# Patient Record
Sex: Female | Born: 1990 | Race: Black or African American | Hispanic: No | Marital: Single | Smoking: Never smoker
Health system: Southern US, Community
[De-identification: ages and names within clinical notes are randomized; demographics above are authoritative.]

## PROBLEM LIST (undated history)

## (undated) ENCOUNTER — Inpatient Hospital Stay (HOSPITAL_COMMUNITY): Payer: Self-pay

## (undated) DIAGNOSIS — M67432 Ganglion, left wrist: Secondary | ICD-10-CM

## (undated) HISTORY — PX: WISDOM TOOTH EXTRACTION: SHX21

## (undated) HISTORY — PX: GANGLION CYST EXCISION: SHX1691

## (undated) HISTORY — PX: CARPAL TUNNEL RELEASE: SHX101

---
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
## 2008-03-10 ENCOUNTER — Encounter: Admission: RE | Admit: 2008-03-10 | Discharge: 2008-03-10 | Payer: Self-pay | Admitting: Internal Medicine

## 2008-06-09 ENCOUNTER — Emergency Department (HOSPITAL_COMMUNITY): Admission: EM | Admit: 2008-06-09 | Discharge: 2008-06-09 | Payer: Self-pay | Admitting: Emergency Medicine

## 2008-09-24 ENCOUNTER — Ambulatory Visit (HOSPITAL_COMMUNITY): Admission: RE | Admit: 2008-09-24 | Discharge: 2008-09-24 | Payer: Self-pay | Admitting: Obstetrics & Gynecology

## 2008-11-02 ENCOUNTER — Inpatient Hospital Stay (HOSPITAL_COMMUNITY): Admission: AD | Admit: 2008-11-02 | Discharge: 2008-11-02 | Payer: Self-pay | Admitting: Obstetrics

## 2008-11-20 ENCOUNTER — Ambulatory Visit (HOSPITAL_COMMUNITY): Admission: RE | Admit: 2008-11-20 | Discharge: 2008-11-20 | Payer: Self-pay | Admitting: Obstetrics & Gynecology

## 2009-04-10 ENCOUNTER — Inpatient Hospital Stay (HOSPITAL_COMMUNITY): Admission: AD | Admit: 2009-04-10 | Discharge: 2009-04-10 | Payer: Self-pay | Admitting: Obstetrics & Gynecology

## 2009-04-15 ENCOUNTER — Inpatient Hospital Stay (HOSPITAL_COMMUNITY): Admission: AD | Admit: 2009-04-15 | Discharge: 2009-04-18 | Payer: Self-pay | Admitting: Obstetrics

## 2009-08-24 ENCOUNTER — Emergency Department (HOSPITAL_COMMUNITY): Admission: EM | Admit: 2009-08-24 | Discharge: 2009-08-25 | Payer: Self-pay | Admitting: Emergency Medicine

## 2009-09-29 ENCOUNTER — Emergency Department (HOSPITAL_COMMUNITY): Admission: EM | Admit: 2009-09-29 | Discharge: 2009-09-29 | Payer: Self-pay | Admitting: Family Medicine

## 2010-05-04 ENCOUNTER — Emergency Department (HOSPITAL_COMMUNITY)
Admission: EM | Admit: 2010-05-04 | Discharge: 2010-05-04 | Disposition: A | Discharge status: Home / Self Care | Payer: No Typology Code available for payment source | Attending: Emergency Medicine | Admitting: Emergency Medicine

## 2010-05-04 DIAGNOSIS — Z043 Encounter for examination and observation following other accident: Secondary | ICD-10-CM | POA: Insufficient documentation

## 2010-06-13 LAB — URINALYSIS, ROUTINE W REFLEX MICROSCOPIC
Glucose, UA: NEGATIVE mg/dL
Ketones, ur: NEGATIVE mg/dL
Protein, ur: 30 mg/dL — AB
pH: 6.5 (ref 5.0–8.0)

## 2010-06-13 LAB — CBC
HCT: 36 % (ref 36.0–46.0)
Hemoglobin: 10 g/dL — ABNORMAL LOW (ref 12.0–15.0)
Hemoglobin: 11.9 g/dL — ABNORMAL LOW (ref 12.0–15.0)
MCHC: 33.9 g/dL (ref 30.0–36.0)
Platelets: 144 10*3/uL — ABNORMAL LOW (ref 150–400)
RBC: 3.27 MIL/uL — ABNORMAL LOW (ref 3.87–5.11)
RDW: 14.2 % (ref 11.5–15.5)
WBC: 12.7 10*3/uL — ABNORMAL HIGH (ref 4.0–10.5)
WBC: 12.9 10*3/uL — ABNORMAL HIGH (ref 4.0–10.5)

## 2010-06-13 LAB — URINE MICROSCOPIC-ADD ON

## 2010-07-03 LAB — GC/CHLAMYDIA PROBE AMP, GENITAL
Chlamydia, DNA Probe: NEGATIVE
GC Probe Amp, Genital: NEGATIVE

## 2010-07-03 LAB — WET PREP, GENITAL
Clue Cells Wet Prep HPF POC: NONE SEEN
Trich, Wet Prep: NONE SEEN
Yeast Wet Prep HPF POC: NONE SEEN

## 2010-07-08 LAB — URINALYSIS, ROUTINE W REFLEX MICROSCOPIC
Bilirubin Urine: NEGATIVE
Glucose, UA: NEGATIVE mg/dL
Hgb urine dipstick: NEGATIVE
Ketones, ur: 15 mg/dL — AB
Protein, ur: 30 mg/dL — AB
Urobilinogen, UA: 1 mg/dL (ref 0.0–1.0)

## 2010-07-08 LAB — URINE MICROSCOPIC-ADD ON

## 2010-07-18 IMAGING — CR DG CHEST 2V
2 series · 2 of 2 positions shown · non-contrast
Comparison: Chest 06/09/2008.

CLINICAL DATA: Chest pain.

CHEST - 2 VIEW

[w chest pa]
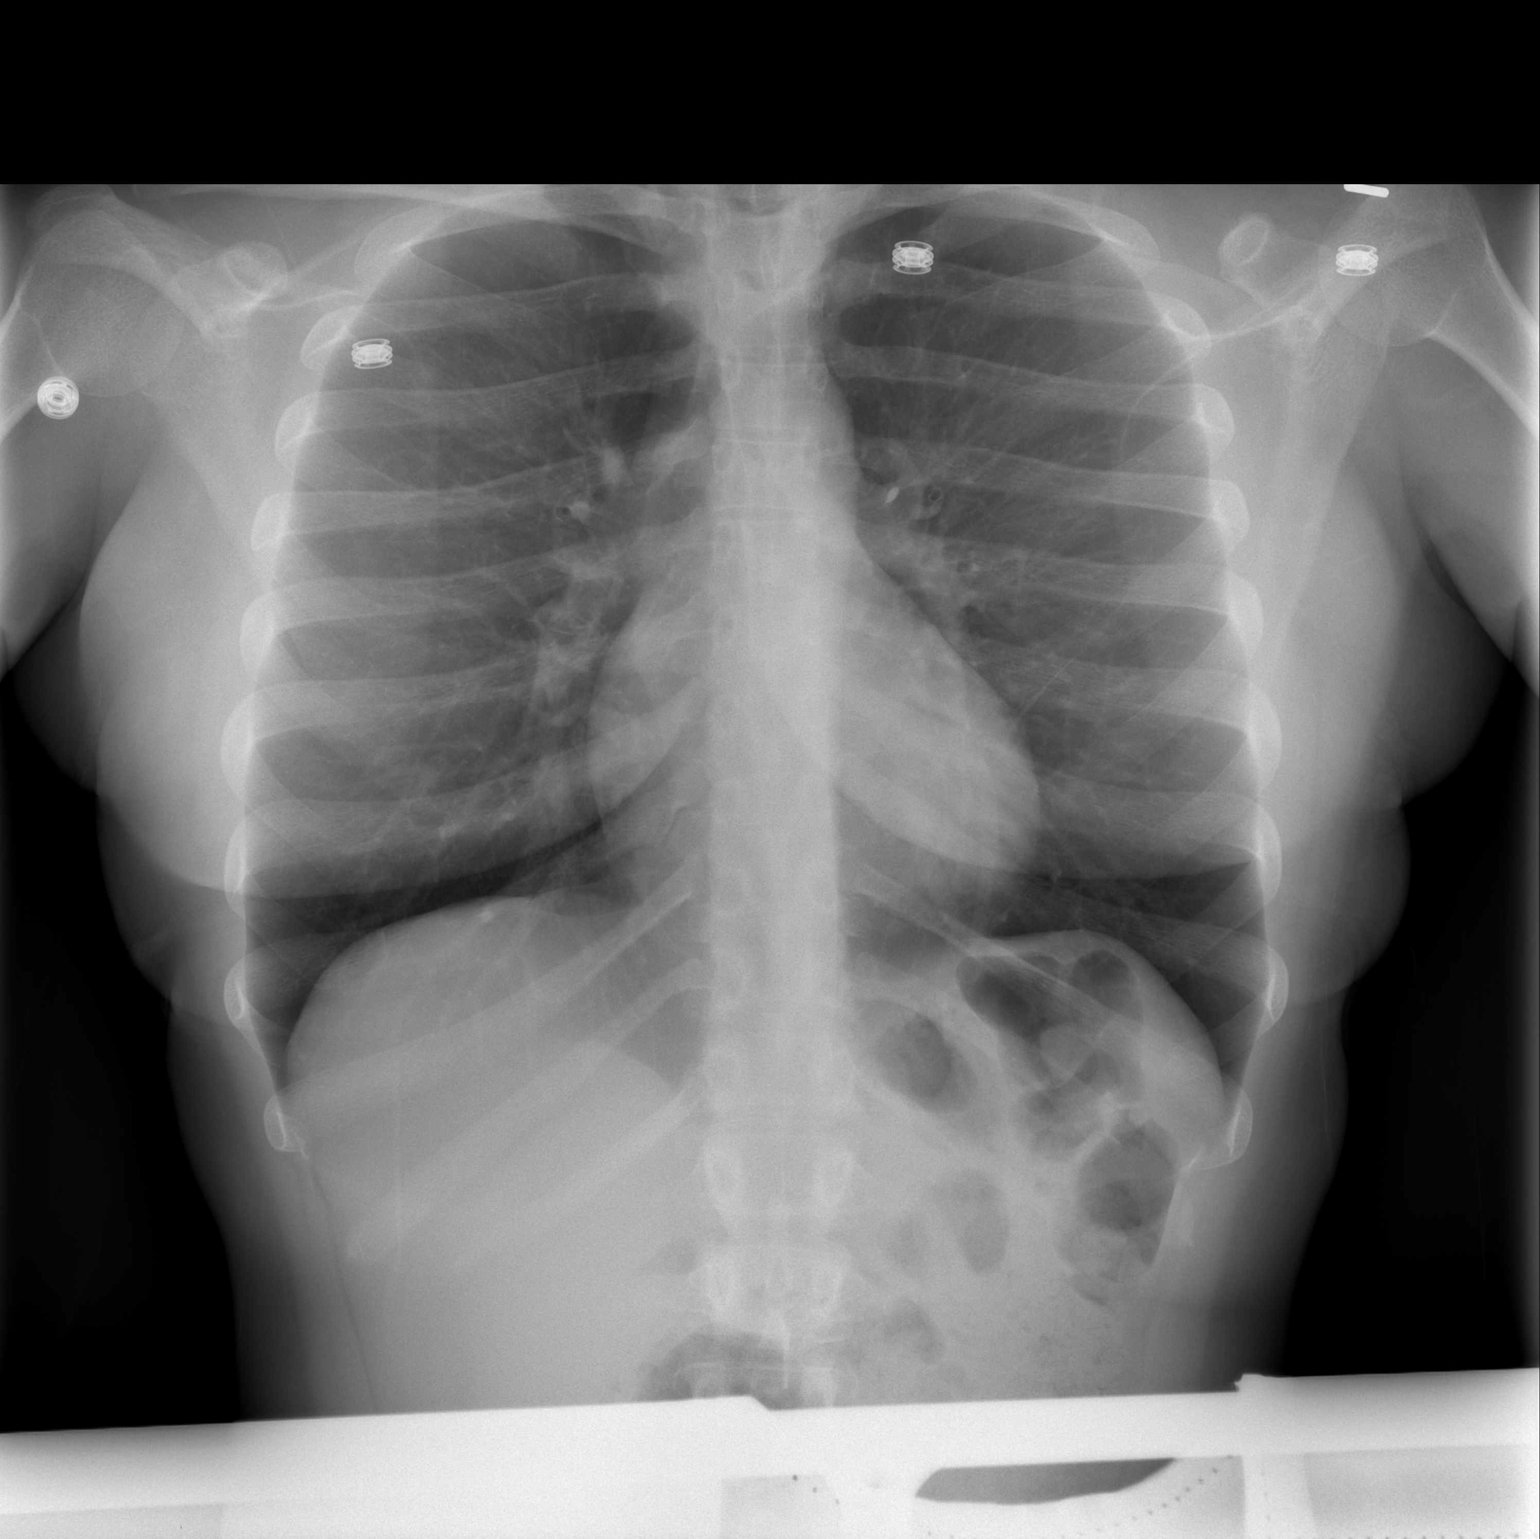

[w chest lat]
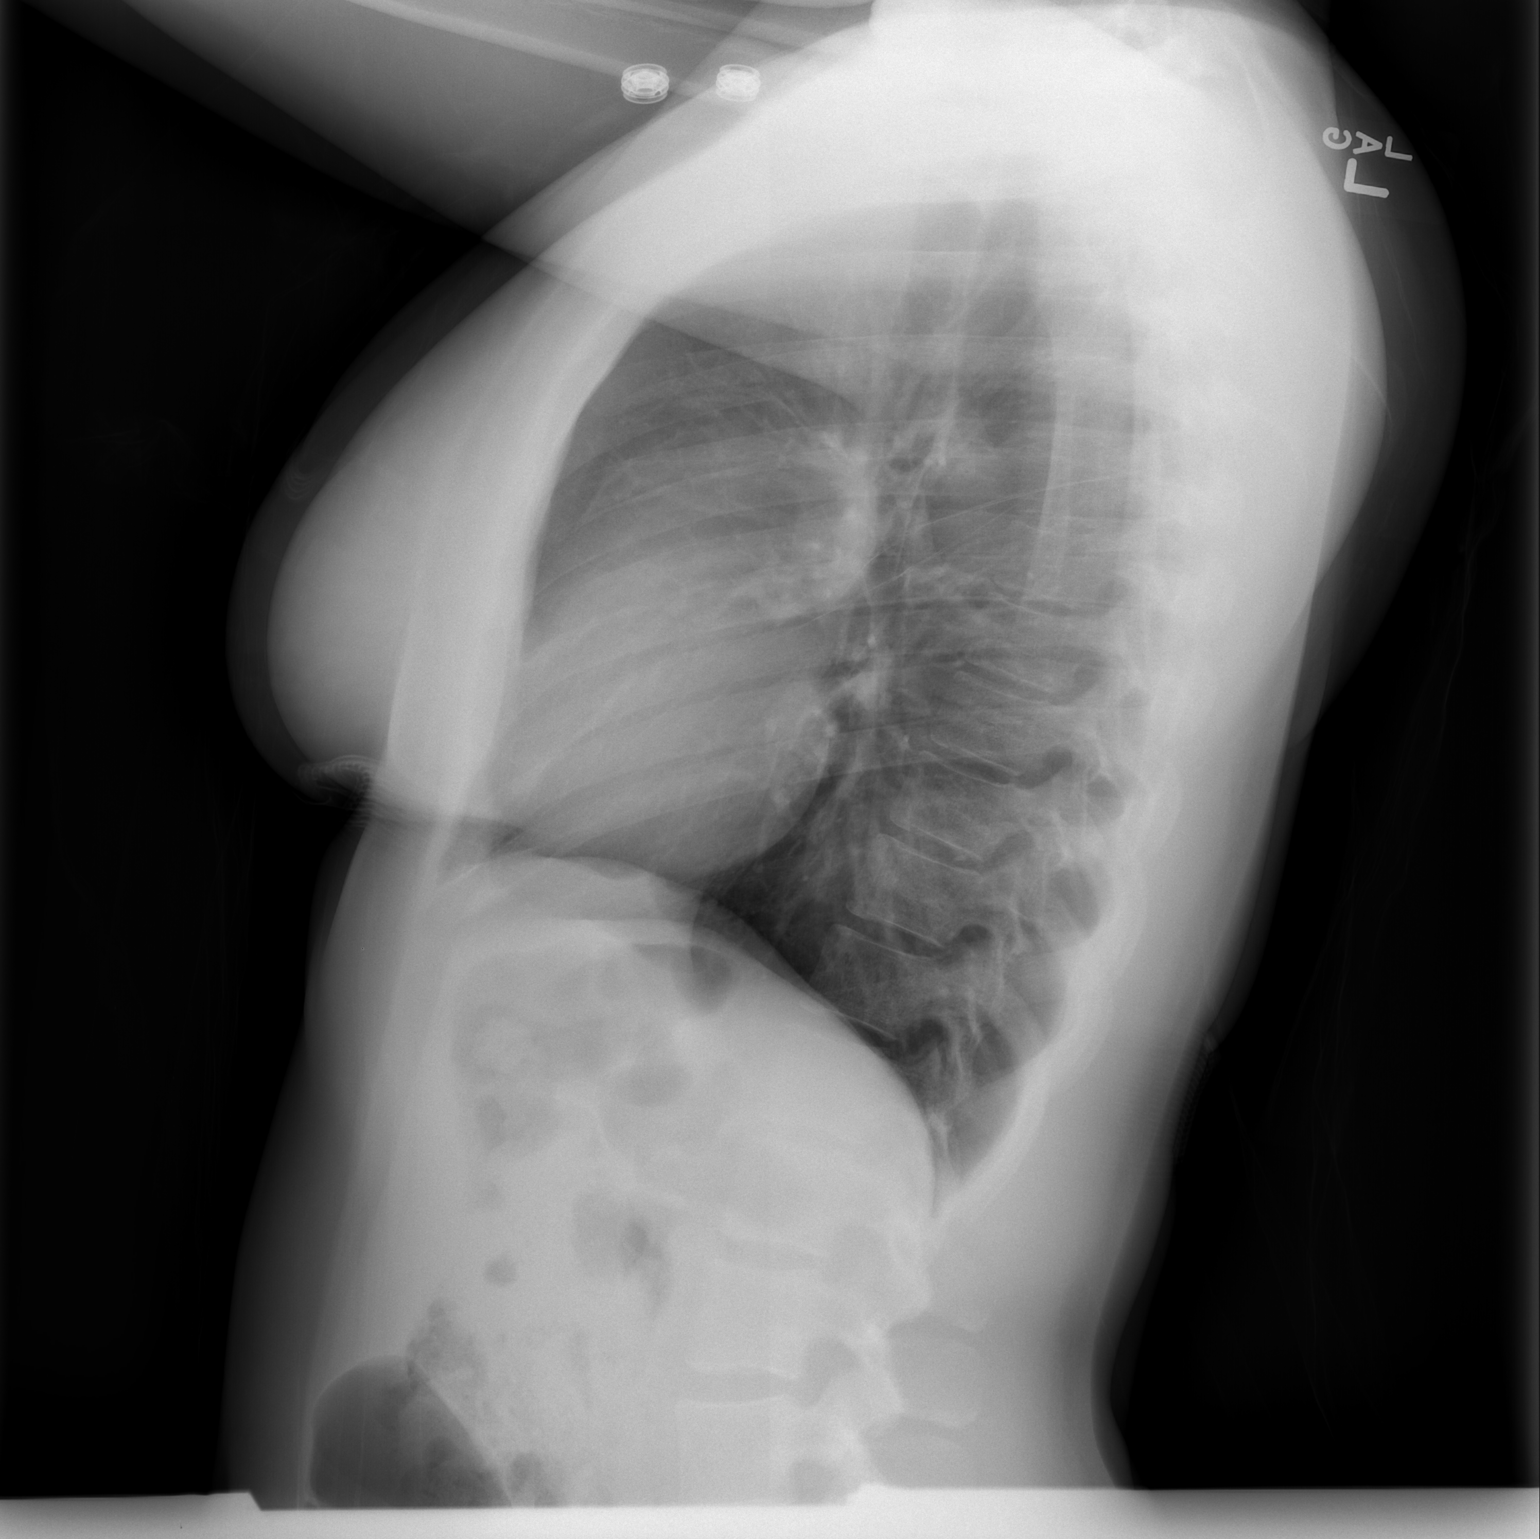

[2 of 2 positions shown; findings below may reference images not displayed]

FINDINGS: Lungs clear.  No pleural effusion or pneumothorax.  Heart
size normal.  No focal bony abnormality.
IMPRESSION: Negative chest.

## 2010-12-20 LAB — POCT URINALYSIS DIP (DEVICE)
Glucose, UA: NEGATIVE
Nitrite: NEGATIVE
Operator id: 235561
Protein, ur: 30 — AB
Specific Gravity, Urine: 1.02
Urobilinogen, UA: 1

## 2010-12-20 LAB — POCT PREGNANCY, URINE
Operator id: 235561
Preg Test, Ur: POSITIVE

## 2011-01-07 LAB — GC/CHLAMYDIA PROBE AMP, GENITAL
Chlamydia, DNA Probe: POSITIVE — AB
GC Probe Amp, Genital: NEGATIVE

## 2011-01-07 LAB — POCT PREGNANCY, URINE: Operator id: 282151

## 2011-01-07 LAB — WET PREP, GENITAL: Clue Cells Wet Prep HPF POC: NONE SEEN

## 2011-07-03 ENCOUNTER — Encounter (HOSPITAL_COMMUNITY): Payer: Self-pay

## 2011-07-03 ENCOUNTER — Emergency Department (HOSPITAL_COMMUNITY)
Admission: EM | Admit: 2011-07-03 | Discharge: 2011-07-03 | Disposition: A | Discharge status: Home / Self Care | Payer: Medicaid Other | Source: Home / Self Care | Attending: Family Medicine | Admitting: Family Medicine

## 2011-07-03 ENCOUNTER — Emergency Department (INDEPENDENT_AMBULATORY_CARE_PROVIDER_SITE_OTHER)
Admission: EM | Admit: 2011-07-03 | Discharge: 2011-07-03 | Disposition: A | Discharge status: Home / Self Care | Payer: Medicaid Other | Source: Home / Self Care | Attending: Emergency Medicine | Admitting: Emergency Medicine

## 2011-07-03 DIAGNOSIS — H00029 Hordeolum internum unspecified eye, unspecified eyelid: Secondary | ICD-10-CM

## 2011-07-03 MED ORDER — TOBRAMYCIN-DEXAMETHASONE 0.3-0.1 % OP SUSP: 1.0000 [drp] | Freq: Four times a day (QID) | OPHTHALMIC | Status: AC

## 2011-07-03 NOTE — Discharge Instructions (Signed)
Sty  A sty (hordeolum) is an infection of a gland in the eyelid located at the base of the eyelash. A sty may develop a white or yellow head of pus. It can be puffy (swollen). Usually, the sty will burst and pus will come out on its own. They do not leave lumps in the eyelid once they drain.  A sty is often confused with another form of cyst of the eyelid called a chalazion. Chalazions occur within the eyelid and not on the edge where the bases of the eyelashes are. They often are red, sore and then form firm lumps in the eyelid.  CAUSES    Germs (bacteria).   Lasting (chronic) eyelid inflammation.  SYMPTOMS    Tenderness, redness and swelling along the edge of the eyelid at the base of the eyelashes.   Sometimes, there is a white or yellow head of pus. It may or may not drain.  DIAGNOSIS   An ophthalmologist will be able to distinguish between a sty and a chalazion and treat the condition appropriately.   TREATMENT    Styes are typically treated with warm packs (compresses) until drainage occurs.   In rare cases, medicines that kill germs (antibiotics) may be prescribed. These antibiotics may be in the form of drops, cream or pills.   If a hard lump has formed, it is generally necessary to do a small incision and remove the hardened contents of the cyst in a minor surgical procedure done in the office.   In suspicious cases, your caregiver may send the contents of the cyst to the lab to be certain that it is not a rare, but dangerous form of cancer of the glands of the eyelid.  HOME CARE INSTRUCTIONS    Wash your hands often and dry them with a clean towel. Avoid touching your eyelid. This may spread the infection to other parts of the eye.   Apply heat to your eyelid for 10 to 20 minutes, several times a day, to ease pain and help to heal it faster.   Do not squeeze the sty. Allow it to drain on its own. Wash your eyelid carefully 3 to 4 times per day to remove any pus.  SEEK IMMEDIATE MEDICAL CARE IF:     Your eye becomes painful or puffy (swollen).   Your vision changes.   Your sty does not drain by itself within 3 days.   Your sty comes back within a short period of time, even with treatment.   You have redness (inflammation) around the eye.   You have a fever.  Document Released: 12/22/2004 Document Revised: 03/03/2011 Document Reviewed: 08/26/2008  [employer] Patient Information 2012 [employer].

## 2011-07-03 NOTE — ED Provider Notes (Addendum)
History     CSN: [id number]  Arrival date & time 07/03/11  8295   First MD Initiated Contact with Patient 07/03/11 1839      Chief Complaint  Patient presents with  . Eye Problem    right lower eyelid swollen    (Consider location/radiation/quality/duration/timing/severity/associated sxs/prior treatment) HPI Comments: Patient presents urgent care today after she has been noticing right lower eyelid to be swollen and mildly tender for about 2 days she describes that during the day the swelling and tenderness is much worse as the day progresses swelling seems to get better. She has been applying warm compresses to it with partial relief.  On Thursday she underwent minimal surgical procedure to insert a subcutaneous contraceptive device. Has been sore and have noticed some bleeding soaking through the posterior sterile-strips was also requesting a form to be checked for signs of infection.  Patient is a 21 y.o. female presenting with eye problem. The history is provided by the patient.  Eye Problem  This is a new problem. The current episode started 2 days ago. The problem occurs constantly. The problem has been gradually worsening. There is pain in the right eye. There was no injury mechanism. Pertinent negatives include no numbness, no blurred vision, no decreased vision, no discharge, no double vision, no foreign body sensation, no photophobia, no eye redness, no nausea, no tingling and no weakness.    History reviewed. No pertinent past medical history.  History reviewed. No pertinent past surgical history.  No family history on file.  History  Substance Use Topics  . Smoking status: Never Smoker   . Smokeless tobacco: Not on file  . Alcohol Use: No    OB History    Grav Para Term Preterm Abortions TAB SAB Ect Mult Living                  Review of Systems  Constitutional: Negative for activity change.  HENT: Negative for neck pain and ear discharge.   Eyes: Positive  for itching. Negative for blurred vision, double vision, photophobia, discharge, redness and visual disturbance.  Gastrointestinal: Negative for nausea.  Neurological: Negative for dizziness, tingling, weakness, numbness and headaches.    Allergies  Review of patient's allergies indicates no known allergies.  Home Medications  No current outpatient prescriptions on file.  There were no vitals taken for this visit.  Physical Exam  Nursing note and vitals reviewed. Constitutional: She appears well-developed and well-nourished.  HENT:  Head: Normocephalic.  Eyes: Conjunctivae and EOM are normal. Right eye exhibits no discharge and no exudate. No foreign body present in the right eye. Left eye exhibits hordeolum. Left eye exhibits no discharge and no exudate. No foreign body present in the left eye. No scleral icterus.  Skin: Skin is warm. Lesion noted. No abrasion noted.       ED Course  Procedures (including critical care time)  Labs Reviewed - No data to display No results found.   No diagnosis found.    MDM  Patient presents with 2 different complaints early internal horduolum and concern about some soreness minimal bleeding of a recently inserted contraceptive left upper inner aspect of her arm        Jimmie Molly, MD 07/03/11 2014  Jimmie Molly, MD 07/03/11 2014

## 2011-07-03 NOTE — ED Notes (Signed)
Right lower eyelid swollen, has used warm compresses without relief for 2 days

## 2012-03-31 ENCOUNTER — Emergency Department (INDEPENDENT_AMBULATORY_CARE_PROVIDER_SITE_OTHER)
Admission: EM | Admit: 2012-03-31 | Discharge: 2012-03-31 | Disposition: A | Discharge status: Home / Self Care | Payer: Self-pay | Source: Home / Self Care

## 2012-03-31 ENCOUNTER — Encounter (HOSPITAL_COMMUNITY): Payer: Self-pay | Admitting: Emergency Medicine

## 2012-03-31 DIAGNOSIS — H6590 Unspecified nonsuppurative otitis media, unspecified ear: Secondary | ICD-10-CM

## 2012-03-31 LAB — POCT URINALYSIS DIP (DEVICE)
Bilirubin Urine: NEGATIVE
Glucose, UA: NEGATIVE mg/dL
Specific Gravity, Urine: 1.015 (ref 1.005–1.030)
Urobilinogen, UA: 1 mg/dL (ref 0.0–1.0)

## 2012-03-31 LAB — POCT PREGNANCY, URINE: Preg Test, Ur: NEGATIVE

## 2012-03-31 MED ORDER — DIPHENHYDRAMINE HCL 50 MG/ML IJ SOLN: 25.0000 mg | Freq: Once | INTRAMUSCULAR | Status: DC

## 2012-03-31 MED ORDER — DIPHENHYDRAMINE HCL 50 MG/ML IJ SOLN
25.0000 mg | Freq: Once | INTRAMUSCULAR | Status: AC
  Administered 2012-03-31: 25 mg via INTRAMUSCULAR

## 2012-03-31 MED ORDER — DIPHENHYDRAMINE HCL 50 MG/ML IJ SOLN
INTRAMUSCULAR | Status: AC
  Filled 2012-03-31: qty 1

## 2012-03-31 NOTE — ED Provider Notes (Signed)
History     CSN: [id number]  Arrival date & time 03/31/12  1421   None     Chief Complaint  Patient presents with  . Dizziness    HPI: The history is provided by the patient. No language interpreter was used.  Pt reports that she had cold symptoms approx 2-3 weeks ago that resolved. Since that time she has had persistent intermittent dizziness and occasional mild frontal area h/a. The dizziness occurs with sudden position change and bending over. At times she feels dizzy when she is sitting. States it feels like she is moving but she is not. States the dizziness feels more like her equilibrium is off as if she were drunk bu is not. She denies ear pain or drainage, fever, sorethroat or other associated sx's.  History reviewed. No pertinent past medical history.  Past Surgical History  Procedure Date  . Mouth surgery     No family history on file.  History  Substance Use Topics  . Smoking status: Never Smoker   . Smokeless tobacco: Not on file  . Alcohol Use: No    OB History    Grav Para Term Preterm Abortions TAB SAB Ect Mult Living                  Review of Systems  Constitutional: Negative for fever and activity change.  HENT: Negative for ear pain, congestion, facial swelling, rhinorrhea, sneezing, tinnitus and ear discharge.   Eyes: Negative for discharge.  Respiratory: Negative for cough and shortness of breath.   Neurological: Positive for headaches.    Allergies  Review of patient's allergies indicates no known allergies.  Home Medications   Current Outpatient Rx  Name  Route  Sig  Dispense  Refill  . IMPLANON [place]   Subcutaneous   Inject into the skin.           BP 138/81  Pulse 71  Temp 98.2 F (36.8 C) (Oral)  Resp 18  SpO2 99%  Physical Exam  Constitutional: She is oriented to person, place, and time. She appears well-developed and well-nourished.  HENT:  Head: Normocephalic and atraumatic.  Right Ear: No drainage or tenderness.  Tympanic membrane is not erythematous. A middle ear effusion is present.  Left Ear: No drainage or tenderness. Tympanic membrane is not erythematous. A middle ear effusion is present.  Nose: Nose normal.  Mouth/Throat: Uvula is midline, oropharynx is clear and moist and mucous membranes are normal.       Bilateral serous otitis-Fluid clear  Eyes: Conjunctivae normal and EOM are normal. Pupils are equal, round, and reactive to light. Right eye exhibits no discharge. Left eye exhibits no discharge.  Neck: Normal range of motion. Neck supple.  Cardiovascular: Normal rate.   Pulmonary/Chest: Effort normal.  Neurological: She is alert and oriented to person, place, and time. She has normal strength and normal reflexes. No cranial nerve deficit. She displays a negative Romberg sign. Coordination and gait normal.    ED Course  Procedures   Labs Reviewed  POCT URINALYSIS DIP (DEVICE) - Abnormal; Notable for the following:    Leukocytes, UA TRACE (*)  Biochemical Testing Only. Please order routine urinalysis from main lab if confirmatory testing is needed.   All other components within normal limits  POCT PREGNANCY, URINE   No results found.   No diagnosis found.    MDM  Otherwise healthy 22 y/o w/ c/o dizziness and mild intermittent h/a since a cold 2 to 3 weeks  ago. Neurological exam w/o focal deficits. PE findings c/w serous otitis. Put on antihistamines x 5-7 days w/ ENT referral if no improvement.        Leanne Chang, NP 03/31/12 1842

## 2012-03-31 NOTE — ED Notes (Signed)
Pt c/o feeling dizzy and having headaches x3 days.  Sx include: nauseas  [facility] but [facility] couldn't help her Denies: fevers, vomiting, diarrhea, ear problems  Unsure of LMP.Alison Morton it was 2-3 months ago.   She is alert w/no signs of acute distress.

## 2012-04-03 NOTE — ED Provider Notes (Signed)
Medical screening examination/treatment/procedure(s) were performed by resident physician or non-physician practitioner and as supervising physician I was immediately available for consultation/collaboration.   Joliene Salvador DOUGLAS MD.    Jaydee Conran D Ercole Georg, MD 04/03/12 1804 

## 2012-05-11 ENCOUNTER — Ambulatory Visit: Payer: Self-pay | Admitting: Internal Medicine

## 2012-06-14 ENCOUNTER — Telehealth: Payer: Self-pay | Admitting: *Deleted

## 2012-06-14 NOTE — Telephone Encounter (Signed)
Please call regarding nexplanon.  Pt wants to give herself a break from birth control- cycle is "out of wack' and weight gain. Pt is having prolonged bleeding x3 weeks. Pt got nexplanon 06/2011. Pt given appointment for removal.

## 2012-07-25 ENCOUNTER — Ambulatory Visit: Payer: Self-pay | Admitting: Obstetrics & Gynecology

## 2012-07-26 ENCOUNTER — Ambulatory Visit: Payer: Self-pay | Admitting: Obstetrics & Gynecology

## 2012-08-15 ENCOUNTER — Ambulatory Visit (INDEPENDENT_AMBULATORY_CARE_PROVIDER_SITE_OTHER): Payer: BC Managed Care – PPO | Admitting: Obstetrics & Gynecology

## 2012-08-15 ENCOUNTER — Encounter: Payer: Self-pay | Admitting: Obstetrics & Gynecology

## 2012-08-15 VITALS — BP 118/78 | HR 91 | Temp 98.3°F | Ht 62.0 in | Wt 160.0 lb

## 2012-08-15 DIAGNOSIS — Z3046 Encounter for surveillance of implantable subdermal contraceptive: Secondary | ICD-10-CM

## 2012-08-15 NOTE — Progress Notes (Signed)
Reasons for removal:  Pt choice   A timeout was performed confirming the patient, the procedure and allergy status. The patient's left  arm was palpated and the implant device located. The area was prepped with Betadinex3. The distal end of the device was palpated and 3 cc of 1% lidocaine was injected. A 5 mm incision was made. Any fibrotic tissue was carefully dissected away using blunt and/or sharp dissection. The device was removed in an intact manner. Steri-strips and a sterile dressing were applied to the incision. The patient tolerated the procedure well.  New contraceptive method: no method

## 2012-08-15 NOTE — Patient Instructions (Signed)

## 2012-08-15 NOTE — Progress Notes (Deleted)
.   Subjective:     Alison Morton is a 22 y.o. female here for her Nexplanon removal, she had it inserted 06/30/11.  Current complaints: irregular cycles.  Patient states that she wants a break from birth control.   Personal health questionnaire reviewed: {yes/no:9010}.   Gynecologic History Patient's last menstrual period was 07/26/2012. Contraception: Nexplanon Last Pap: 02/2010. Results were: normal Last mammogram: N/A  Obstetric History OB History   Grav Para Term Preterm Abortions TAB SAB Ect Mult Living   1 1 1       1      # Outc Date GA Lbr Len/2nd Wgt Sex Del Anes PTL Lv   1 TRM 1/11 [redacted]w[redacted]d  7lb15oz(3.6kg) M SVD EPI  Yes   Comments: No complications       {Common ambulatory SmartLinks:19316}  Review of Systems {ros; complete:30496}    Objective:    {exam; complete:18323}    Assessment:    Healthy female exam.    Plan:    {plan:19193}

## 2012-08-16 ENCOUNTER — Encounter: Payer: Self-pay | Admitting: Obstetrics & Gynecology

## 2013-04-23 ENCOUNTER — Emergency Department (INDEPENDENT_AMBULATORY_CARE_PROVIDER_SITE_OTHER)
Admission: EM | Admit: 2013-04-23 | Discharge: 2013-04-23 | Disposition: A | Discharge status: Home / Self Care | Payer: Self-pay | Source: Home / Self Care | Attending: Family Medicine | Admitting: Family Medicine

## 2013-04-23 ENCOUNTER — Encounter (HOSPITAL_COMMUNITY): Payer: Self-pay | Admitting: Emergency Medicine

## 2013-04-23 ENCOUNTER — Other Ambulatory Visit (HOSPITAL_COMMUNITY)
Admission: RE | Admit: 2013-04-23 | Discharge: 2013-04-23 | Disposition: A | Discharge status: Home / Self Care | Payer: Self-pay | Source: Ambulatory Visit | Attending: Family Medicine | Admitting: Family Medicine

## 2013-04-23 DIAGNOSIS — Z113 Encounter for screening for infections with a predominantly sexual mode of transmission: Secondary | ICD-10-CM | POA: Insufficient documentation

## 2013-04-23 DIAGNOSIS — N760 Acute vaginitis: Secondary | ICD-10-CM

## 2013-04-23 LAB — POCT URINALYSIS DIP (DEVICE)
Bilirubin Urine: NEGATIVE
GLUCOSE, UA: NEGATIVE mg/dL
Hgb urine dipstick: NEGATIVE
Ketones, ur: NEGATIVE mg/dL
Leukocytes, UA: NEGATIVE
Nitrite: NEGATIVE
Protein, ur: NEGATIVE mg/dL
Urobilinogen, UA: 0.2 mg/dL (ref 0.0–1.0)
pH: 6.5 (ref 5.0–8.0)

## 2013-04-23 LAB — POCT PREGNANCY, URINE: Preg Test, Ur: NEGATIVE

## 2013-04-23 NOTE — ED Provider Notes (Signed)
CSN: [id number]     Arrival date & time 04/23/13  [id number] History   First MD Initiated Contact with Patient 04/23/13 [phone]     Chief Complaint  Patient presents with  . Vaginal Discharge   (Consider location/radiation/quality/duration/timing/severity/associated sxs/prior Treatment) Patient is a 23 y.o. female presenting with vaginal discharge. The history is provided by the patient.  Vaginal Discharge Quality:  White Severity:  Mild Onset quality:  Gradual Duration:  3 days Timing:  Intermittent Chronicity:  New Associated symptoms: genital lesions, nausea, vaginal itching and vomiting   Associated symptoms: no abdominal pain and no dysuria     History reviewed. No pertinent past medical history. Past Surgical History  Procedure Laterality Date  . Mouth surgery     Family History  Problem Relation Age of Onset  . Cancer Maternal Grandmother     bone  . Stroke Maternal Grandfather   . Cancer Maternal Grandfather     breast  . Hypertension Father    History  Substance Use Topics  . Smoking status: Never Smoker   . Smokeless tobacco: Not on file  . Alcohol Use: No   OB History   Grav Para Term Preterm Abortions TAB SAB Ect Mult Living   1 1 1       1      Review of Systems  Constitutional: Negative.   Gastrointestinal: Positive for nausea and vomiting. Negative for abdominal pain.  Genitourinary: Positive for vaginal discharge. Negative for dysuria.    Allergies  Flonase  Home Medications   Current Outpatient Rx  Name  Route  Sig  Dispense  Refill  . Etonogestrel (IMPLANON [place])   Subcutaneous   Inject into the skin.          BP 134/87  Pulse 90  Temp(Src) 99.1 F (37.3 C) (Oral)  Resp 14  SpO2 99% Physical Exam  Nursing note and vitals reviewed. Constitutional: She is oriented to person, place, and time. She appears well-developed and well-nourished.  Abdominal: Soft. Bowel sounds are normal.  Genitourinary: Vagina normal and uterus normal.     There is lesion on the left labia. No tenderness around the vagina. No vaginal discharge found.  Neurological: She is alert and oriented to person, place, and time.  Skin: Skin is warm and dry.    ED Course  Procedures (including critical care time) Labs Review Labs Reviewed  POCT URINALYSIS DIP (DEVICE)  POCT PREGNANCY, URINE   Imaging Review No results found.  EKG Interpretation    Date/Time:    Ventricular Rate:    PR Interval:    QRS Duration:   QT Interval:    QTC Calculation:   R Axis:     Text Interpretation:              MDM      Linna HoffJames D Dalphine Cowie, MD 04/23/13 [phone]

## 2013-04-23 NOTE — ED Notes (Signed)
Call back number verified.  

## 2013-04-23 NOTE — ED Notes (Signed)
Pt c/o white/creamy vag d/c onset 3 days w/sxs that include irritation Denies: f/v/n/d, abd/back pain, urinary sxs She is alert w/no signs of acute distress.

## 2013-04-23 NOTE — Discharge Instructions (Signed)
We will call with test results and treat as indicated °

## 2013-04-25 ENCOUNTER — Telehealth (HOSPITAL_COMMUNITY): Payer: Self-pay | Admitting: *Deleted

## 2013-04-25 NOTE — ED Notes (Addendum)
GC/Chlamydia neg.,  Affirm: Candida pos., Gardnerella and Trich neg.  Discussed with Dr. Artis FlockKindl and he gave a VO for Diflucan 150 mg. 1 po now and 1 refill to take in 1 week if needed.  I called pt. and left a message to call.  Call 1. Alison Morton, Alison Morton M 04/25/2013 Pt. called back.  Pt. verified x 2 and given results.  Pt. told she needs Diflucan for a yeast infection and has 1 refill to take in 1 week if needed.  Pt. wants Rx. called to the [facility] on [address].  Pt.'s questions answered.  Pt. asked what she can do to prevent frequent yeast infections.  I told her to discuss with her OB-GYN.  Rx. called to pharmacist.

## 2014-01-27 ENCOUNTER — Encounter (HOSPITAL_COMMUNITY): Payer: Self-pay | Admitting: Emergency Medicine

## 2015-03-24 ENCOUNTER — Encounter (HOSPITAL_COMMUNITY): Payer: Self-pay | Admitting: *Deleted

## 2015-03-24 ENCOUNTER — Inpatient Hospital Stay (HOSPITAL_COMMUNITY)
Admission: AD | Admit: 2015-03-24 | Discharge: 2015-03-25 | Disposition: A | Discharge status: Home / Self Care | Payer: BLUE CROSS/BLUE SHIELD | Source: Ambulatory Visit | Attending: Family Medicine | Admitting: Family Medicine

## 2015-03-24 DIAGNOSIS — O418X10 Other specified disorders of amniotic fluid and membranes, first trimester, not applicable or unspecified: Secondary | ICD-10-CM

## 2015-03-24 DIAGNOSIS — O209 Hemorrhage in early pregnancy, unspecified: Secondary | ICD-10-CM

## 2015-03-24 DIAGNOSIS — R3915 Urgency of urination: Secondary | ICD-10-CM | POA: Diagnosis present

## 2015-03-24 DIAGNOSIS — N3000 Acute cystitis without hematuria: Secondary | ICD-10-CM | POA: Diagnosis not present

## 2015-03-24 DIAGNOSIS — O26891 Other specified pregnancy related conditions, first trimester: Secondary | ICD-10-CM | POA: Insufficient documentation

## 2015-03-24 DIAGNOSIS — Z3A01 Less than 8 weeks gestation of pregnancy: Secondary | ICD-10-CM | POA: Insufficient documentation

## 2015-03-24 DIAGNOSIS — N760 Acute vaginitis: Secondary | ICD-10-CM | POA: Insufficient documentation

## 2015-03-24 DIAGNOSIS — R1030 Lower abdominal pain, unspecified: Secondary | ICD-10-CM | POA: Diagnosis not present

## 2015-03-24 DIAGNOSIS — A499 Bacterial infection, unspecified: Secondary | ICD-10-CM | POA: Diagnosis not present

## 2015-03-24 DIAGNOSIS — O23591 Infection of other part of genital tract in pregnancy, first trimester: Secondary | ICD-10-CM | POA: Diagnosis not present

## 2015-03-24 DIAGNOSIS — O468X1 Other antepartum hemorrhage, first trimester: Secondary | ICD-10-CM

## 2015-03-24 DIAGNOSIS — B9689 Other specified bacterial agents as the cause of diseases classified elsewhere: Secondary | ICD-10-CM

## 2015-03-24 DIAGNOSIS — Z3491 Encounter for supervision of normal pregnancy, unspecified, first trimester: Secondary | ICD-10-CM

## 2015-03-24 DIAGNOSIS — O4691 Antepartum hemorrhage, unspecified, first trimester: Secondary | ICD-10-CM | POA: Diagnosis not present

## 2015-03-24 LAB — URINE MICROSCOPIC-ADD ON

## 2015-03-24 LAB — URINALYSIS, ROUTINE W REFLEX MICROSCOPIC
BILIRUBIN URINE: NEGATIVE
Glucose, UA: NEGATIVE mg/dL
Ketones, ur: NEGATIVE mg/dL
Nitrite: POSITIVE — AB
Protein, ur: 30 mg/dL — AB
Specific Gravity, Urine: 1.015 (ref 1.005–1.030)
pH: 8.5 — ABNORMAL HIGH (ref 5.0–8.0)

## 2015-03-24 LAB — CBC
HEMATOCRIT: 36.4 % (ref 36.0–46.0)
HEMOGLOBIN: 12.1 g/dL (ref 12.0–15.0)
MCH: 29.6 pg (ref 26.0–34.0)
MCHC: 33.2 g/dL (ref 30.0–36.0)
MCV: 89 fL (ref 78.0–100.0)
Platelets: 239 10*3/uL (ref 150–400)
RBC: 4.09 MIL/uL (ref 3.87–5.11)
RDW: 13.8 % (ref 11.5–15.5)
WBC: 11.7 10*3/uL — ABNORMAL HIGH (ref 4.0–10.5)

## 2015-03-24 LAB — ABO/RH: ABO/RH(D): O POS

## 2015-03-24 LAB — POCT PREGNANCY, URINE: PREG TEST UR: POSITIVE — AB

## 2015-03-24 NOTE — MAU Provider Note (Signed)
History     CSN: [id number]  Arrival date and time: 03/24/15 2141   First Provider Initiated Contact with Patient 03/24/15 2316        Chief Complaint  Patient presents with  . Abdominal Pain  . Urinary Urgency   HPI  Alison Morton is a 24 y.o. G2P1001 at [redacted]w[redacted]d who presents with urinary urgency, abdominal pain, & pink discharge.  Intermittent sharp lower abdominal pain x 2 weeks. Rates as 3/10 & describes as dull/sharp pain. No treatment. Currently not hurting.  Dysuria & urgency this week. Denies hematuria, flank pain, or fever.  Pink discharge on toilet paper today. Denies recent intercourse. Denies any other discharge.  Denies n/v/d. Some constipation. Last BM was Saturday.  Scheduled first prenatal visit with [facility] ob/gyn this Friday. Has never been seen there before.    OB History    Gravida Para Term Preterm AB TAB SAB Ectopic Multiple Living   No past medical history on file.  Past Surgical History  Procedure Laterality Date  . Mouth surgery      Family History  Problem Relation Age of Onset  . Cancer Maternal Grandmother     bone  . Stroke Maternal Grandfather   . Cancer Maternal Grandfather     breast  . Hypertension Father     Social History  Substance Use Topics  . Smoking status: Never Smoker   . Smokeless tobacco: Not on file  . Alcohol Use: No    Allergies:  Allergies  Allergen Reactions  . Flonase [Fluticasone Propionate]     Prescriptions prior to admission  Medication Sig Dispense Refill Last Dose  . Etonogestrel (IMPLANON [place]) Inject into the skin.   Unknown at Unknown time    Review of Systems  Constitutional: Negative for fever and chills.  Gastrointestinal: Positive for abdominal pain and constipation. Negative for nausea, vomiting and diarrhea.  Genitourinary: Positive for dysuria and urgency. Negative for frequency, hematuria and flank pain.   Physical Exam   Blood pressure 138/81, pulse 88,  temperature 98.1 F (36.7 C), temperature source Oral, resp. rate 18, height  (1.6 m), weight 189 lb (85.73 kg), last menstrual period 02/04/2015.  Physical Exam  Nursing note and vitals reviewed. Constitutional: She is oriented to person, place, and time. She appears well-developed and well-nourished. No distress.  HENT:  Head: Normocephalic and atraumatic.  Eyes: Conjunctivae are normal. Right eye exhibits no discharge. Left eye exhibits no discharge. No scleral icterus.  Neck: Normal range of motion.  Cardiovascular: Normal rate, regular rhythm and normal heart sounds.   No murmur heard. Respiratory: Effort normal and breath sounds normal. No respiratory distress. She has no wheezes.  GI: Soft. Bowel sounds are normal. She exhibits no distension. There is no tenderness. There is no rebound and no CVA tenderness.  Genitourinary: Uterus normal. Cervix exhibits discharge (minimal amount of thin gray discharge. no blood. ). Cervix exhibits no motion tenderness and no friability. Right adnexum displays no mass, no tenderness and no fullness. Left adnexum displays no mass, no tenderness and no fullness.  Cervix closed  Neurological: She is alert and oriented to person, place, and time.  Skin: Skin is warm and dry. She is not diaphoretic.  Psychiatric: She has a normal mood and affect. Her behavior is normal. Judgment and thought content normal.    MAU Course  Procedures Results for orders placed or performed during the  hospital encounter of 03/24/15 (from the past 24 hour(s))  Urinalysis, Routine w reflex microscopic (not at [facility])     Status: Abnormal   Collection Time: 03/24/15 10:04 PM  Result Value Ref Range   Color, Urine YELLOW YELLOW   APPearance CLEAR CLEAR   Specific Gravity, Urine 1.015 1.005 - 1.030   pH 8.5 (H) 5.0 - 8.0   Glucose, UA NEGATIVE NEGATIVE mg/dL   Hgb urine dipstick LARGE (A) NEGATIVE   Bilirubin Urine NEGATIVE NEGATIVE   Ketones, ur NEGATIVE NEGATIVE mg/dL    Protein, ur 30 (A) NEGATIVE mg/dL   Nitrite POSITIVE (A) NEGATIVE   Leukocytes, UA MODERATE (A) NEGATIVE  Urine microscopic-add on     Status: Abnormal   Collection Time: 03/24/15 10:04 PM  Result Value Ref Range   Squamous Epithelial / LPF 0-5 (A) NONE SEEN   WBC, UA 6-30 0 - 5 WBC/hpf   RBC / HPF 6-30 0 - 5 RBC/hpf   Bacteria, UA MANY (A) NONE SEEN  Pregnancy, urine POC     Status: Abnormal   Collection Time: 03/24/15 10:25 PM  Result Value Ref Range   Preg Test, Ur POSITIVE (A) NEGATIVE  CBC     Status: Abnormal   Collection Time: 03/24/15 11:30 PM  Result Value Ref Range   WBC 11.7 (H) 4.0 - 10.5 K/uL   RBC 4.09 3.87 - 5.11 MIL/uL   Hemoglobin 12.1 12.0 - 15.0 g/dL   HCT 16.1 09.6 - 04.5 %   MCV 89.0 78.0 - 100.0 fL   MCH 29.6 26.0 - 34.0 pg   MCHC 33.2 30.0 - 36.0 g/dL   RDW 40.9 81.1 - 91.4 %   Platelets 239 150 - 400 K/uL  ABO/Rh     Status: None   Collection Time: 03/24/15 11:30 PM  Result Value Ref Range   ABO/RH(D) O POS   hCG, quantitative, pregnancy     Status: Abnormal   Collection Time: 03/24/15 11:30 PM  Result Value Ref Range   hCG, Beta Chain, Sharene Butters, S [id number] (H) <5 mIU/mL  Wet prep, genital     Status: Abnormal   Collection Time: 03/24/15 11:55 PM  Result Value Ref Range   Yeast Wet Prep HPF POC NONE SEEN NONE SEEN   Trich, Wet Prep NONE SEEN NONE SEEN   Clue Cells Wet Prep HPF POC PRESENT (A) NONE SEEN   WBC, Wet Prep HPF POC MANY (A) NONE SEEN   Sperm NONE SEEN    US Ob Comp Less 14 Wks  03/25/2015  CLINICAL DATA:  24 year old pregnant female with vaginal bleeding EXAM: OBSTETRIC <14 WK [place] AND TRANSVAGINAL OB US TECHNIQUE: Both transabdominal and transvaginal ultrasound examinations were performed for complete evaluation of the gestation as well as the maternal uterus, adnexal regions, and pelvic cul-de-sac. Transvaginal technique was performed to assess early pregnancy. COMPARISON:  None. FINDINGS: Intrauterine gestational sac: Single intrauterine  gestational sac. Yolk sac:  Seen Embryo:  Present Cardiac Activity: Detected Heart Rate: 153  bpm CRL:  13  mm   7 w   4 d                  [place] EDC: 11/07/2015 Subchorionic hemorrhage:  5.1 x 1.5 x 3.1 cm subchronic hemorrhage. Maternal uterus/adnexae: Unremarkable. Trace free fluid within the pelvis. IMPRESSION: Single live intrauterine pregnancy with an estimated gestational age of [redacted] weeks, 4 days. Small subchorionic hemorrhage. Follow-up recommended. Electronically Signed   By: Ceasar Mons.D.  On: 03/25/2015 00:43   [place] Ob Transvaginal  03/25/2015  CLINICAL DATA:  24 year old pregnant female with vaginal bleeding EXAM: OBSTETRIC <14 WK US AND TRANSVAGINAL OB US TECHNIQUE: Both transabdominal and transvaginal ultrasound examinations were performed for complete evaluation of the gestation as well as the maternal uterus, adnexal regions, and pelvic cul-de-sac. Transvaginal technique was performed to assess early pregnancy. COMPARISON:  None. FINDINGS: Intrauterine gestational sac: Single intrauterine gestational sac. Yolk sac:  Seen Embryo:  Present Cardiac Activity: Detected Heart Rate: 153  bpm CRL:  13  mm   7 w   4 d                  US EDC: 11/07/2015 Subchorionic hemorrhage:  5.1 x 1.5 x 3.1 cm subchronic hemorrhage. Maternal uterus/adnexae: Unremarkable. Trace free fluid within the pelvis. IMPRESSION: Single live intrauterine pregnancy with an estimated gestational age of [redacted] weeks, 4 days. Small subchorionic hemorrhage. Follow-up recommended. Electronically Signed   By: Elgie CollardArash  Radparvar M.D.   On: 03/25/2015 00:43    MDM O positive Ultrasound shows SIUP @ 2232w4d with small SCH UTI per u/a   Assessment and Plan  A: 1. Normal IUP (intrauterine pregnancy) on prenatal ultrasound, first trimester   2. Vaginal bleeding in pregnancy, first trimester   3. Subchorionic hematoma in first trimester   4. BV (bacterial vaginosis)   5. Acute cystitis without hematuria    P: Discharge home Rx keflex  & flagyl Discussed reasons to return to MAU Urine culture pending Keep scheduled prenatal appt  Judeth HornErin Kaitlan Bin, NP  03/24/2015, 10:15 PM

## 2015-03-24 NOTE — MAU Note (Signed)
Patient having urgency to urinate but no burning. Also having dull pains in lower abdomen. No leaking or bleeding but some pink discharge

## 2015-03-25 ENCOUNTER — Inpatient Hospital Stay (HOSPITAL_COMMUNITY): Payer: BLUE CROSS/BLUE SHIELD

## 2015-03-25 ENCOUNTER — Encounter (HOSPITAL_COMMUNITY): Payer: Self-pay | Admitting: Student

## 2015-03-25 DIAGNOSIS — O4691 Antepartum hemorrhage, unspecified, first trimester: Secondary | ICD-10-CM

## 2015-03-25 DIAGNOSIS — N760 Acute vaginitis: Secondary | ICD-10-CM | POA: Diagnosis not present

## 2015-03-25 DIAGNOSIS — A499 Bacterial infection, unspecified: Secondary | ICD-10-CM

## 2015-03-25 DIAGNOSIS — O23591 Infection of other part of genital tract in pregnancy, first trimester: Secondary | ICD-10-CM

## 2015-03-25 LAB — HIV ANTIBODY (ROUTINE TESTING W REFLEX): HIV SCREEN 4TH GENERATION: NONREACTIVE

## 2015-03-25 LAB — WET PREP, GENITAL
Sperm: NONE SEEN
Trich, Wet Prep: NONE SEEN
Yeast Wet Prep HPF POC: NONE SEEN

## 2015-03-25 LAB — HCG, QUANTITATIVE, PREGNANCY: hCG, Beta Chain, Quant, S: 106305 m[IU]/mL — ABNORMAL HIGH (ref <5)

## 2015-03-25 MED ORDER — METRONIDAZOLE 500 MG PO TABS: 500.0000 mg | ORAL_TABLET | Freq: Two times a day (BID) | ORAL | Status: DC

## 2015-03-25 MED ORDER — CEPHALEXIN 500 MG PO CAPS: 500.0000 mg | ORAL_CAPSULE | Freq: Four times a day (QID) | ORAL | Status: DC

## 2015-03-25 NOTE — Discharge Instructions (Signed)
Bacterial Vaginosis °Bacterial vaginosis is an infection of the vagina. It happens when too many germs (bacteria) grow in the vagina. Having this infection puts you at risk for getting other infections from sex. Treating this infection can help lower your risk for other infections, such as:  °· Chlamydia. °· Gonorrhea. °· HIV. °· Herpes. °HOME CARE °· Take your medicine as told by your doctor. °· Finish your medicine even if you start to feel better. °· Tell your sex partner that you have an infection. They should see their doctor for treatment. °· During treatment: °· Avoid sex or use condoms correctly. °· Do not douche. °· Do not drink alcohol unless your doctor tells you it is ok. °· Do not breastfeed unless your doctor tells you it is ok. °GET HELP IF: °· You are not getting better after 3 days of treatment. °· You have more grey fluid (discharge) coming from your vagina than before. °· You have more pain than before. °· You have a fever. °MAKE SURE YOU:  °· Understand these instructions. °· Will watch your condition. °· Will get help right away if you are not doing well or get worse. °  °This information is not intended to replace advice given to you by your health care provider. Make sure you discuss any questions you have with your health care provider. °  °Document Released: 12/22/2007 Document Revised: 04/04/2014 Document Reviewed: 10/24/2012 °Elsevier Interactive Patient Education ©2016 Elsevier Inc. °Subchorionic Hematoma °A subchorionic hematoma is a gathering of blood between the outer wall of the placenta and the inner wall of the womb (uterus). The placenta is the organ that connects the fetus to the wall of the uterus. The placenta performs the feeding, breathing (oxygen to the fetus), and waste removal (excretory work) of the fetus.  °Subchorionic hematoma is the most common abnormality found on a result from ultrasonography done during the first trimester or early second trimester of pregnancy. If  there has been little or no vaginal bleeding, early small hematomas usually shrink on their own and do not affect your baby or pregnancy. The blood is gradually absorbed over 1-2 weeks. When bleeding starts later in pregnancy or the hematoma is larger or occurs in an older pregnant woman, the outcome may not be as good. Larger hematomas may get bigger, which increases the chances for miscarriage. Subchorionic hematoma also increases the risk of premature detachment of the placenta from the uterus, preterm (premature) labor, and stillbirth. °HOME CARE INSTRUCTIONS °· Stay on bed rest if your health care provider recommends this. Although bed rest will not prevent more bleeding or prevent a miscarriage, your health care provider may recommend bed rest until you are advised otherwise. °· Avoid heavy lifting (more than 10 lb [4.5 kg]), exercise, sexual intercourse, or douching as directed by your health care provider. °· Keep track of the number of pads you use each day and how soaked (saturated) they are. Write down this information. °· Do not use tampons. °· Keep all follow-up appointments as directed by your health care provider. Your health care provider may ask you to have follow-up blood tests or ultrasound tests or both. °SEEK IMMEDIATE MEDICAL CARE IF: °· You have severe cramps in your stomach, back, abdomen, or pelvis. °· You have a fever. °· You pass large clots or tissue. Save any tissue for your health care provider to look at. °· Your bleeding increases or you become lightheaded, feel weak, or have fainting episodes. °  °This information is   not intended to replace advice given to you by your health care provider. Make sure you discuss any questions you have with your health care provider.   Document Released: 06/29/2006 Document Revised: 04/04/2014 Document Reviewed: 10/11/2012 Elsevier Interactive Patient Education 2016 Elsevier Inc. Pregnancy and Urinary Tract Infection A urinary tract infection  (UTI) is a bacterial infection of the urinary tract. Infection of the urinary tract can include the ureters, kidneys (pyelonephritis), bladder (cystitis), and urethra (urethritis). All pregnant women should be screened for bacteria in the urinary tract. Identifying and treating a UTI will decrease the risk of preterm labor and developing more serious infections in both the mother and baby. CAUSES Bacteria germs cause almost all UTIs.  RISK FACTORS Many factors can increase your chances of getting a UTI during pregnancy. These include:  Having a short urethra.  Poor toilet and hygiene habits.  Sexual intercourse.  Blockage of urine along the urinary tract.  Problems with the pelvic muscles or nerves.  Diabetes.  Obesity.  Bladder problems after having several children.  Previous history of UTI. SIGNS AND SYMPTOMS   Pain, burning, or a stinging feeling when urinating.  Suddenly feeling the need to urinate right away (urgency).  Loss of bladder control (urinary incontinence).  Frequent urination, more than is common with pregnancy.  Lower abdominal or back discomfort.  Cloudy urine.  Blood in the urine (hematuria).  Fever. When the kidneys are infected, the symptoms may be:  Back pain.  Flank pain on the right side more so than the left.  Fever.  Chills.  Nausea.  Vomiting. DIAGNOSIS  A urinary tract infection is usually diagnosed through urine tests. Additional tests and procedures are sometimes done. These may include:  Ultrasound exam of the kidneys, ureters, bladder, and urethra.  Looking in the bladder with a lighted tube (cystoscopy). TREATMENT Typically, UTIs can be treated with antibiotic medicines.  HOME CARE INSTRUCTIONS   Only take over-the-counter or prescription medicines as directed by your health care provider. If you were prescribed antibiotics, take them as directed. Finish them even if you start to feel better.  Drink enough fluids to  keep your urine clear or pale yellow.  Do not have sexual intercourse until the infection is gone and your health care provider says it is okay.  Make sure you are tested for UTIs throughout your pregnancy. These infections often come back. Preventing a UTI in the Future  Practice good toilet habits. Always wipe from front to back. Use the tissue only once.  Do not hold your urine. Empty your bladder as soon as possible when the urge comes.  Do not douche or use deodorant sprays.  Wash with soap and warm water around the genital area and the anus.  Empty your bladder before and after sexual intercourse.  Wear underwear with a cotton crotch.  Avoid caffeine and carbonated drinks. They can irritate the bladder.  Drink cranberry juice or take cranberry pills. This may decrease the risk of getting a UTI.  Do not drink alcohol.  Keep all your appointments and tests as scheduled. SEEK MEDICAL CARE IF:   Your symptoms get worse.  You are still having fevers 2 or more days after treatment begins.  You have a rash.  You feel that you are having problems with medicines prescribed.  You have abnormal vaginal discharge. SEEK IMMEDIATE MEDICAL CARE IF:   You have back or flank pain.  You have chills.  You have blood in your urine.  You have  nausea and vomiting.  You have contractions of your uterus.  You have a gush of fluid from the vagina. MAKE SURE YOU:  Understand these instructions.   Will watch your condition.   Will get help right away if you are not doing well or get worse.    This information is not intended to replace advice given to you by your health care provider. Make sure you discuss any questions you have with your health care provider.   Document Released: 07/09/2010 Document Revised: 01/02/2013 Document Reviewed: 10/11/2012 Elsevier Interactive Patient Education [employer]

## 2015-03-26 LAB — CULTURE, OB URINE

## 2015-03-27 ENCOUNTER — Telehealth: Payer: Self-pay | Admitting: Physician Assistant

## 2015-03-27 MED ORDER — AMOXICILLIN 500 MG PO CAPS: 500.0000 mg | ORAL_CAPSULE | Freq: Three times a day (TID) | ORAL | Status: DC

## 2015-03-27 NOTE — Telephone Encounter (Signed)
Urine culture with GBS.  Pt was on Keflex.  She is informed of culture results and will switch to amoxicillin.  Pt understands.  Rx sent to pharmacy.

## 2015-03-28 LAB — GC/CHLAMYDIA PROBE AMP (~~LOC~~) NOT AT ARMC
Chlamydia: NEGATIVE
Neisseria Gonorrhea: NEGATIVE

## 2015-03-29 NOTE — L&D Delivery Note (Addendum)
Pt complete and at +2 station with epidural controlling pain. She had been pushing for about . Pt pushed for 10-15 more mins to deliver a viable female infant in OA position over intact perineum. Anterior and posterior shoulders spontaneously delivered next ; body easily followed. Infant was placed on mothers abdomen and bulb suction of mouth and nose performed. Copious terminal meconium noted. Cord was then clamped and cut by FOB after stopped pulsing per request. Cord blood was obtained. Baby had a vigorous spontaneous cry noted on delivery. Placenta then delivered about 5 mins later intact, 3VC shultz. Fundal massage performed and pitocin per protocol. Fundus firm. Vaginal inspection confirmed superficial perineal lac - bleeding and small periurethral abrasion that was hemostatic - one stitch placed in perineum after pressure failed to stop bleeding. Mother and baby stable. Counts correct. Apgars of 9 and 9  Desires circumcision in office

## 2015-04-08 LAB — OB RESULTS CONSOLE ABO/RH: RH TYPE: POSITIVE

## 2015-04-08 LAB — OB RESULTS CONSOLE GC/CHLAMYDIA
CHLAMYDIA, DNA PROBE: NEGATIVE
GC PROBE AMP, GENITAL: NEGATIVE

## 2015-04-08 LAB — OB RESULTS CONSOLE HIV ANTIBODY (ROUTINE TESTING): HIV: NONREACTIVE

## 2015-04-08 LAB — OB RESULTS CONSOLE RPR: RPR: NONREACTIVE

## 2015-04-08 LAB — OB RESULTS CONSOLE HEPATITIS B SURFACE ANTIGEN: HEP B S AG: NEGATIVE

## 2015-04-08 LAB — OB RESULTS CONSOLE ANTIBODY SCREEN: ANTIBODY SCREEN: NEGATIVE

## 2015-04-08 LAB — OB RESULTS CONSOLE RUBELLA ANTIBODY, IGM: Rubella: IMMUNE

## 2015-06-25 DIAGNOSIS — O368390 Maternal care for abnormalities of the fetal heart rate or rhythm, unspecified trimester, not applicable or unspecified: Secondary | ICD-10-CM | POA: Insufficient documentation

## 2015-06-25 DIAGNOSIS — O358XX0 Maternal care for other (suspected) fetal abnormality and damage, not applicable or unspecified: Secondary | ICD-10-CM | POA: Insufficient documentation

## 2015-06-25 DIAGNOSIS — O35BXX0 Maternal care for other (suspected) fetal abnormality and damage, fetal cardiac anomalies, not applicable or unspecified: Secondary | ICD-10-CM | POA: Insufficient documentation

## 2015-10-24 ENCOUNTER — Encounter (HOSPITAL_COMMUNITY): Payer: Self-pay | Admitting: *Deleted

## 2015-10-24 ENCOUNTER — Inpatient Hospital Stay (HOSPITAL_COMMUNITY)
Admission: AD | Admit: 2015-10-24 | Discharge: 2015-10-24 | Disposition: A | Discharge status: Home / Self Care | Payer: Managed Care, Other (non HMO) | Source: Ambulatory Visit | Attending: Obstetrics and Gynecology | Admitting: Obstetrics and Gynecology

## 2015-10-24 DIAGNOSIS — N898 Other specified noninflammatory disorders of vagina: Secondary | ICD-10-CM | POA: Insufficient documentation

## 2015-10-24 DIAGNOSIS — O2693 Pregnancy related conditions, unspecified, third trimester: Secondary | ICD-10-CM | POA: Diagnosis present

## 2015-10-24 DIAGNOSIS — Z3A37 37 weeks gestation of pregnancy: Secondary | ICD-10-CM | POA: Diagnosis not present

## 2015-10-24 LAB — POCT FERN TEST: POCT FERN TEST: NEGATIVE

## 2015-10-24 NOTE — MAU Provider Note (Signed)
Ms. Alison Morton is a 25 y.o. G2P1001 at [redacted]w[redacted]d  who presents to MAU today complaining of 0100. She denies continued LOF. She denies vaginal bleeding or regular contractions. She denies complications with the pregnancy. She reports good fetal movement.     BP 119/74 (BP Location: Right Arm)   Pulse 94   Temp 98.4 F (36.9 C)   Resp 18   Ht 5\' 3"  (1.6 m)   Wt 209 lb 3.2 oz (94.9 kg)   LMP 02/04/2015 (Approximate)   BMI 37.06 kg/m  GENERAL: Well-developed, well-nourished female in no acute distress.  HEAD: Normocephalic, atraumatic.  CHEST: Normal effort of breathing, regular heart rate ABDOMEN: Soft, nontender, nondistended.  PELVIC: Normal external female genitalia. Vagina is pink and rugated.  Normal discharge.  Negative pooling. Gravid uterus.   EXTREMITIES: No cyanosis, clubbing, or edema Dilation: 2 Effacement (%): Thick Cervical Position: Posterior Exam by:: Vonzella Nipple PA  Crist Fat - Negative  Fetal Monitoring:  Baseline: 145 bpm, moderate variability, 15 x 15 accelerations, possible subtle early decelerations noted, resolved with left lateral position change Contractions: q 3-4 minutes, mild  A: SIUP at [redacted]w[redacted]d Vaginal discharge in pregnancy, third trimester  P: Report given to RN to contact MD on call for further instructions  Marny Lowenstein, PA-C 10/24/2015 2:14 AM

## 2015-10-24 NOTE — Progress Notes (Signed)
Dr Senaida Ores notified of pt's admission and status. Aware of ctx pattern, neg fern, sve, reactive FHR with some variables. OK for pt to go home

## 2015-10-24 NOTE — MAU Note (Signed)
About 0100 went to BR and noticed panties wet. Has not leaked anymore fld. Some lower abd pressure mostly when getting up and down

## 2015-10-24 NOTE — Progress Notes (Signed)
Written and verbal d/c instructions given and understanding vocied 

## 2015-10-24 NOTE — Discharge Instructions (Signed)
Braxton Hicks Contractions °Contractions of the uterus can occur throughout pregnancy. Contractions are not always a sign that you are in labor.  °WHAT ARE BRAXTON HICKS CONTRACTIONS?  °Contractions that occur before labor are called Braxton Hicks contractions, or false labor. Toward the end of pregnancy (32-34 weeks), these contractions can develop more often and may become more forceful. This is not true labor because these contractions do not result in opening (dilatation) and thinning of the cervix. They are sometimes difficult to tell apart from true labor because these contractions can be forceful and people have different pain tolerances. You should not feel embarrassed if you go to the hospital with false labor. Sometimes, the only way to tell if you are in true labor is for your health care provider to look for changes in the cervix. °If there are no prenatal problems or other health problems associated with the pregnancy, it is completely safe to be sent home with false labor and await the onset of true labor. °HOW CAN YOU TELL THE DIFFERENCE BETWEEN TRUE AND FALSE LABOR? °False Labor °· The contractions of false labor are usually shorter and not as hard as those of true labor.   °· The contractions are usually irregular.   °· The contractions are often felt in the front of the lower abdomen and in the groin.   °· The contractions may go away when you walk around or change positions while lying down.   °· The contractions get weaker and are shorter lasting as time goes on.   °· The contractions do not usually become progressively stronger, regular, and closer together as with true labor.   °True Labor °· Contractions in true labor last 30-70 seconds, become very regular, usually become more intense, and increase in frequency.   °· The contractions do not go away with walking.   °· The discomfort is usually felt in the top of the uterus and spreads to the lower abdomen and low back.   °· True labor can be  determined by your health care provider with an exam. This will show that the cervix is dilating and getting thinner.   °WHAT TO REMEMBER °· Keep up with your usual exercises and follow other instructions given by your health care provider.   °· Take medicines as directed by your health care provider.   °· Keep your regular prenatal appointments.   °· Eat and drink lightly if you think you are going into labor.   °· If Braxton Hicks contractions are making you uncomfortable:   °¨ Change your position from lying down or resting to walking, or from walking to resting.   °¨ Sit and rest in a tub of warm water.   °¨ Drink 2-3 glasses of water. Dehydration may cause these contractions.   °¨ Do slow and deep breathing several times an hour.   °WHEN SHOULD I SEEK IMMEDIATE MEDICAL CARE? °Seek immediate medical care if: °· Your contractions become stronger, more regular, and closer together.   °· You have fluid leaking or gushing from your vagina.   °· You have a fever.   °· You pass blood-tinged mucus.   °· You have vaginal bleeding.   °· You have continuous abdominal pain.   °· You have low back pain that you never had before.   °· You feel your baby's head pushing down and causing pelvic pressure.   °· Your baby is not moving as much as it used to.   °  °This information is not intended to replace advice given to you by your health care provider. Make sure you discuss any questions you have with your health care   provider. °  °Document Released: 03/14/2005 Document Revised: 03/19/2013 Document Reviewed: 12/24/2012 °Elsevier Interactive Patient Education ©2016 Elsevier Inc. ° °

## 2015-10-26 ENCOUNTER — Encounter (HOSPITAL_COMMUNITY): Payer: Self-pay | Admitting: *Deleted

## 2015-10-26 ENCOUNTER — Telehealth (HOSPITAL_COMMUNITY): Payer: Self-pay | Admitting: *Deleted

## 2015-10-26 NOTE — Telephone Encounter (Signed)
Preadmission screen  

## 2015-11-01 ENCOUNTER — Inpatient Hospital Stay (HOSPITAL_COMMUNITY)
Admission: RE | Admit: 2015-11-01 | Discharge: 2015-11-03 | DRG: 775 | Disposition: A | Discharge status: Home / Self Care | Payer: Managed Care, Other (non HMO) | Source: Ambulatory Visit | Attending: Obstetrics and Gynecology | Admitting: Obstetrics and Gynecology

## 2015-11-01 ENCOUNTER — Inpatient Hospital Stay (HOSPITAL_COMMUNITY): Payer: Managed Care, Other (non HMO) | Admitting: Anesthesiology

## 2015-11-01 ENCOUNTER — Encounter (HOSPITAL_COMMUNITY): Payer: Self-pay

## 2015-11-01 DIAGNOSIS — Z3A39 39 weeks gestation of pregnancy: Secondary | ICD-10-CM | POA: Diagnosis not present

## 2015-11-01 DIAGNOSIS — O99824 Streptococcus B carrier state complicating childbirth: Principal | ICD-10-CM | POA: Diagnosis present

## 2015-11-01 DIAGNOSIS — Z3490 Encounter for supervision of normal pregnancy, unspecified, unspecified trimester: Secondary | ICD-10-CM

## 2015-11-01 DIAGNOSIS — Z8249 Family history of ischemic heart disease and other diseases of the circulatory system: Secondary | ICD-10-CM | POA: Diagnosis not present

## 2015-11-01 DIAGNOSIS — Z823 Family history of stroke: Secondary | ICD-10-CM

## 2015-11-01 DIAGNOSIS — Z3483 Encounter for supervision of other normal pregnancy, third trimester: Secondary | ICD-10-CM | POA: Diagnosis present

## 2015-11-01 LAB — TYPE AND SCREEN
ABO/RH(D): O POS
Antibody Screen: NEGATIVE

## 2015-11-01 LAB — CBC
HCT: 36.5 % (ref 36.0–46.0)
Hemoglobin: 12.3 g/dL (ref 12.0–15.0)
MCH: 30.2 pg (ref 26.0–34.0)
MCHC: 33.7 g/dL (ref 30.0–36.0)
MCV: 89.7 fL (ref 78.0–100.0)
PLATELETS: 150 10*3/uL (ref 150–400)
RBC: 4.07 MIL/uL (ref 3.87–5.11)
RDW: 14.9 % (ref 11.5–15.5)
WBC: 9.7 10*3/uL (ref 4.0–10.5)

## 2015-11-01 LAB — OB RESULTS CONSOLE GBS: GBS: POSITIVE

## 2015-11-01 MED ORDER — ONDANSETRON HCL 4 MG PO TABS: 4.0000 mg | ORAL_TABLET | ORAL | Status: DC | PRN

## 2015-11-01 MED ORDER — BUTORPHANOL TARTRATE 1 MG/ML IJ SOLN: 1.0000 mg | INTRAMUSCULAR | Status: DC | PRN

## 2015-11-01 MED ORDER — EPHEDRINE 5 MG/ML INJ
10.0000 mg | INTRAVENOUS | Status: DC | PRN
  Filled 2015-11-01: qty 4

## 2015-11-01 MED ORDER — BUTORPHANOL TARTRATE 1 MG/ML IJ SOLN
INTRAMUSCULAR | Status: AC
  Filled 2015-11-01: qty 1

## 2015-11-01 MED ORDER — DIPHENHYDRAMINE HCL 25 MG PO CAPS: 25.0000 mg | ORAL_CAPSULE | Freq: Four times a day (QID) | ORAL | Status: DC | PRN

## 2015-11-01 MED ORDER — TETANUS-DIPHTH-ACELL PERTUSSIS 5-2.5-18.5 LF-MCG/0.5 IM SUSP: 0.5000 mL | Freq: Once | INTRAMUSCULAR | Status: DC

## 2015-11-01 MED ORDER — OXYCODONE-ACETAMINOPHEN 5-325 MG PO TABS
2.0000 | ORAL_TABLET | ORAL | Status: DC | PRN
Start: 2015-11-01 — End: 2015-11-01

## 2015-11-01 MED ORDER — ONDANSETRON HCL 4 MG/2ML IJ SOLN: 4.0000 mg | INTRAMUSCULAR | Status: DC | PRN

## 2015-11-01 MED ORDER — ACETAMINOPHEN 325 MG PO TABS: 650.0000 mg | ORAL_TABLET | ORAL | Status: DC | PRN

## 2015-11-01 MED ORDER — OXYTOCIN BOLUS FROM INFUSION
500.0000 mL | Freq: Once | INTRAVENOUS | Status: AC
  Administered 2015-11-01: 500 mL/h via INTRAVENOUS

## 2015-11-01 MED ORDER — LACTATED RINGERS IV SOLN: 500.0000 mL | INTRAVENOUS | Status: DC | PRN

## 2015-11-01 MED ORDER — FENTANYL 2.5 MCG/ML BUPIVACAINE 1/10 % EPIDURAL INFUSION (WH - ANES)
14.0000 mL/h | INTRAMUSCULAR | Status: DC | PRN
  Administered 2015-11-01: 14 mL/h via EPIDURAL
  Filled 2015-11-01: qty 125

## 2015-11-01 MED ORDER — ACETAMINOPHEN 325 MG PO TABS
650.0000 mg | ORAL_TABLET | ORAL | Status: DC | PRN
  Administered 2015-11-02: 650 mg via ORAL
  Filled 2015-11-01: qty 2

## 2015-11-01 MED ORDER — WITCH HAZEL-GLYCERIN EX PADS: 1.0000 "application " | MEDICATED_PAD | CUTANEOUS | Status: DC | PRN

## 2015-11-01 MED ORDER — ZOLPIDEM TARTRATE 5 MG PO TABS: 5.0000 mg | ORAL_TABLET | Freq: Every evening | ORAL | Status: DC | PRN

## 2015-11-01 MED ORDER — PHENYLEPHRINE 40 MCG/ML (10ML) SYRINGE FOR IV PUSH (FOR BLOOD PRESSURE SUPPORT)
80.0000 ug | PREFILLED_SYRINGE | INTRAVENOUS | Status: DC | PRN
  Filled 2015-11-01: qty 5

## 2015-11-01 MED ORDER — LIDOCAINE HCL (PF) 1 % IJ SOLN
30.0000 mL | INTRAMUSCULAR | Status: DC | PRN
Start: 2015-11-01 — End: 2015-11-01
  Filled 2015-11-01: qty 30

## 2015-11-01 MED ORDER — SIMETHICONE 80 MG PO CHEW: 80.0000 mg | CHEWABLE_TABLET | ORAL | Status: DC | PRN

## 2015-11-01 MED ORDER — SODIUM CHLORIDE 0.9 % IV SOLN
2.0000 g | Freq: Four times a day (QID) | INTRAVENOUS | Status: DC
  Filled 2015-11-01 (×3): qty 2000

## 2015-11-01 MED ORDER — OXYCODONE-ACETAMINOPHEN 5-325 MG PO TABS: 1.0000 | ORAL_TABLET | ORAL | Status: DC | PRN

## 2015-11-01 MED ORDER — LACTATED RINGERS IV SOLN: 500.0000 mL | Freq: Once | INTRAVENOUS | Status: DC

## 2015-11-01 MED ORDER — PHENYLEPHRINE 40 MCG/ML (10ML) SYRINGE FOR IV PUSH (FOR BLOOD PRESSURE SUPPORT)
80.0000 ug | PREFILLED_SYRINGE | INTRAVENOUS | Status: DC | PRN
  Filled 2015-11-01: qty 5
  Filled 2015-11-01: qty 10

## 2015-11-01 MED ORDER — LIDOCAINE HCL (PF) 1 % IJ SOLN
INTRAMUSCULAR | Status: DC | PRN
  Administered 2015-11-01: 4 mL
  Administered 2015-11-01: 6 mL via EPIDURAL

## 2015-11-01 MED ORDER — COCONUT OIL OIL
1.0000 | TOPICAL_OIL | Status: DC | PRN
Start: 2015-11-01 — End: 2015-11-03

## 2015-11-01 MED ORDER — OXYTOCIN 40 UNITS IN LACTATED RINGERS INFUSION - SIMPLE MED
1.0000 m[IU]/min | INTRAVENOUS | Status: DC
  Administered 2015-11-01: 2 m[IU]/min via INTRAVENOUS
  Filled 2015-11-01: qty 1000

## 2015-11-01 MED ORDER — SENNOSIDES-DOCUSATE SODIUM 8.6-50 MG PO TABS
2.0000 | ORAL_TABLET | ORAL | Status: DC
  Administered 2015-11-01 – 2015-11-02 (×2): 2 via ORAL
  Filled 2015-11-01 (×2): qty 2

## 2015-11-01 MED ORDER — PRENATAL MULTIVITAMIN CH
1.0000 | ORAL_TABLET | Freq: Every day | ORAL | Status: DC
  Administered 2015-11-02 – 2015-11-03 (×2): 1 via ORAL
  Filled 2015-11-01 (×2): qty 1

## 2015-11-01 MED ORDER — SOD CITRATE-CITRIC ACID 500-334 MG/5ML PO SOLN: 30.0000 mL | ORAL | Status: DC | PRN

## 2015-11-01 MED ORDER — IBUPROFEN 600 MG PO TABS
600.0000 mg | ORAL_TABLET | Freq: Four times a day (QID) | ORAL | Status: DC
  Administered 2015-11-01 – 2015-11-03 (×7): 600 mg via ORAL
  Filled 2015-11-01 (×7): qty 1

## 2015-11-01 MED ORDER — ONDANSETRON HCL 4 MG/2ML IJ SOLN
4.0000 mg | Freq: Four times a day (QID) | INTRAMUSCULAR | Status: DC | PRN
  Administered 2015-11-01: 4 mg via INTRAVENOUS
  Filled 2015-11-01 (×2): qty 2

## 2015-11-01 MED ORDER — DIBUCAINE 1 % RE OINT: 1.0000 "application " | TOPICAL_OINTMENT | RECTAL | Status: DC | PRN

## 2015-11-01 MED ORDER — OXYTOCIN 40 UNITS IN LACTATED RINGERS INFUSION - SIMPLE MED: 2.5000 [IU]/h | INTRAVENOUS | Status: DC

## 2015-11-01 MED ORDER — BENZOCAINE-MENTHOL 20-0.5 % EX AERO
1.0000 "application " | INHALATION_SPRAY | CUTANEOUS | Status: DC | PRN
  Administered 2015-11-02 (×2): 1 via TOPICAL
  Filled 2015-11-01 (×2): qty 56

## 2015-11-01 MED ORDER — LACTATED RINGERS IV SOLN
INTRAVENOUS | Status: DC
  Administered 2015-11-01: 08:00:00 via INTRAVENOUS

## 2015-11-01 MED ORDER — TERBUTALINE SULFATE 1 MG/ML IJ SOLN
0.2500 mg | Freq: Once | INTRAMUSCULAR | Status: DC | PRN
  Filled 2015-11-01: qty 1

## 2015-11-01 MED ORDER — DIPHENHYDRAMINE HCL 50 MG/ML IJ SOLN: 12.5000 mg | INTRAMUSCULAR | Status: DC | PRN

## 2015-11-01 MED ORDER — SODIUM CHLORIDE 0.9 % IV SOLN
2.0000 g | Freq: Once | INTRAVENOUS | Status: AC
  Administered 2015-11-01: 2 g via INTRAVENOUS
  Filled 2015-11-01: qty 2000

## 2015-11-01 NOTE — Anesthesia Preprocedure Evaluation (Signed)

## 2015-11-01 NOTE — H&P (Signed)
Alison Morton is a 25 y.o. female presenting for scheduled elective iol. Pt begun prenatal care in first trimester; dating is based on 7 week US. She had an abnormal T screen but nl panorama. Anatomy  US noted mild cardiac arythmia; had a normal echo but possible small VSD. Recommendation was for echo prior to discharge after delivery.  Marland Kitchen.Rest of her pregnancy was complicated by normal myalgia of pregnancy. Pt was reassured several times. She requested elective iol at term. Favorable cervix. GBs noted in urine at [redacted]weeks gestation  OB History    Gravida Para Term Preterm AB Living   3 1 1   1 1    SAB TAB Ectopic Multiple Live Births     1     1     Past Medical History:  Diagnosis Date  . Medical history non-contributory    Past Surgical History:  Procedure Laterality Date  . MOUTH SURGERY     Family History: family history includes Cancer in her maternal grandfather; Hypertension in her father; Stroke in her maternal grandfather. Social History:  reports that she has never smoked. She has never used smokeless tobacco. She reports that she does not drink alcohol or use drugs.     Maternal Diabetes: No Genetic Screening: Abnormal:  Results: Other:abnormal NT but normal panorama Maternal Ultrasounds/Referrals: Abnormal:  Findings:   Fetal Heart Anomalies mild cardiac arythmia Fetal Ultrasounds or other Referrals:  Fetal echo nl echo but ? VSD Maternal Substance Abuse:  No Significant Maternal Medications:  None Significant Maternal Lab Results:  Lab values include: Group B Strep positive Other Comments:  None  Review of Systems  Constitutional: Positive for malaise/fatigue. Negative for chills, fever and weight loss.  HENT: Negative for hearing loss.   Eyes: Negative for blurred vision and double vision.  Respiratory: Positive for shortness of breath.   Cardiovascular: Positive for leg swelling. Negative for chest pain and palpitations.  Gastrointestinal: Positive for abdominal  pain. Negative for heartburn, nausea and vomiting.  Musculoskeletal: Positive for back pain.  Skin: Negative for itching and rash.  Neurological: Negative for dizziness, tingling and headaches.  Psychiatric/Behavioral: Negative for depression, hallucinations, substance abuse and suicidal ideas. The patient is nervous/anxious.    Maternal Medical History:  Reason for admission: Nausea. elective  Contractions: Frequency: irregular and rare.   Perceived severity is mild.    Fetal activity: Perceived fetal activity is normal.   Last perceived fetal movement was within the past hour.    Prenatal complications: no prenatal complications Prenatal Complications - Diabetes: none.    Dilation: 3.5 Effacement (%): 70 Station: -2 Exam by:: Dr Mindi SlickerBanga Blood pressure 114/68, pulse 99, temperature 97.9 F (36.6 C), temperature source Axillary, resp. rate 18, height 5\' 3"  (1.6 m), weight 211 lb (95.7 kg), last menstrual period 02/04/2015. Maternal Exam:  Uterine Assessment: Contraction strength is mild.  Contraction frequency is irregular.   Abdomen: Patient reports generalized tenderness.  Estimated fetal weight is AGA.   Fetal presentation: vertex  Introitus: Normal vulva. Normal vagina.  Pelvis: adequate for delivery.   Cervix: Cervix evaluated by digital exam.     Physical Exam  Constitutional: She is oriented to person, place, and time. She appears well-developed and well-nourished.  Neck: Normal range of motion.  Cardiovascular: Normal rate.   Respiratory: Effort normal.  GI: There is generalized tenderness.  Genitourinary: Vagina normal and uterus normal.  Musculoskeletal: Normal range of motion.  Neurological: She is alert and oriented to person, place, and time.  Skin: Skin is warm and dry.  Psychiatric: She has a normal mood and affect. Her behavior is normal. Judgment and thought content normal.    Prenatal labs: ABO, Rh: --/--/O POS (08/06 0745) Antibody: NEG (08/06  0745) Rubella: Immune (01/11 0000) RPR: Nonreactive (01/11 0000)  HBsAg: Negative (01/11 0000)  HIV: Non-reactive (01/11 0000)  GBS: Positive (08/06 0000)   Assessment/Plan: [id number] at 39 1/[redacted]wks gestation for elective iol Pitocin started AROM done with moderate return of clear fluid CAT 1 strip Pain control prn Anticipate svd   Kairah Leoni Worema Shavar Gorka 11/01/2015, 10:48 AM

## 2015-11-01 NOTE — Anesthesia Procedure Notes (Signed)

## 2015-11-01 NOTE — Anesthesia Pain Management Evaluation Note (Signed)
  CRNA Pain Management Visit Note  Patient: Alison Morton, 25 y.o., female  "Hello I am a member of the anesthesia team at [facility]. We have an anesthesia team available at all times to provide care throughout the hospital, including epidural management and anesthesia for C-section. I don't know your plan for the delivery whether it a natural birth, water birth, IV sedation, nitrous supplementation, doula or epidural, but we want to meet your pain goals."   1.Was your pain managed to your expectations on prior hospitalizations?   Yes   2.What is your expectation for pain management during this hospitalization?     Epidural  3.How can we help you reach that goal? Epidural when possible.  Record the patient's initial score and the patient's pain goal.   Pain: 4  Pain Goal: 1 [facility] wants you to be able to say your pain was always managed very well.  Sande Pickert 11/01/2015

## 2015-11-01 NOTE — Progress Notes (Signed)
Patient ID: Alison Morton, female   DOB: 10-18-1990, 25 y.o.   MRN: [id number] Pt doing well - comfortable with epidural. No complaints VSS EFM- 150, +accels, -decels, moderate variability, cat 1 TOCO - contrations q 2-383mins SVE - 5/70/-2  A/P: Progressing well on pitocin in labor         Recheck prn         Anticipate svd

## 2015-11-01 NOTE — Progress Notes (Signed)
Patient ID: Alison Morton, female   DOB: February 17, 1991, 25 y.o.   MRN: [id number] Pt still comfortable on peanut ball. No complaints VSS EFM- 145, moderate variability, + accels, occasional early decels, cat 1 TOCO - contractions q 3mins SVE - 8.5cm dil  A/P: Continues to progress well in labor on pitocin         Anticipate svd

## 2015-11-01 NOTE — Progress Notes (Signed)
Education provided to patient regarding PCA boluses with epidural. Encouraged patient to turn.  Patient reports pain is better with repositioning and PCA doses and doesn't want to contact anesthesia.

## 2015-11-01 NOTE — Progress Notes (Signed)
Delivery of live viable female by Dr Mindi SlickerBanga, APGARS 9,9

## 2015-11-02 LAB — CBC
HCT: 32.9 % — ABNORMAL LOW (ref 36.0–46.0)
HEMOGLOBIN: 10.9 g/dL — AB (ref 12.0–15.0)
MCH: 30.5 pg (ref 26.0–34.0)
MCHC: 33.1 g/dL (ref 30.0–36.0)
MCV: 92.2 fL (ref 78.0–100.0)
Platelets: 140 10*3/uL — ABNORMAL LOW (ref 150–400)
RBC: 3.57 MIL/uL — ABNORMAL LOW (ref 3.87–5.11)
RDW: 15.2 % (ref 11.5–15.5)
WBC: 13.8 10*3/uL — AB (ref 4.0–10.5)

## 2015-11-02 LAB — RPR: RPR: NONREACTIVE

## 2015-11-02 MED ORDER — OXYCODONE HCL 5 MG PO TABS
5.0000 mg | ORAL_TABLET | ORAL | Status: DC | PRN
Start: 2015-11-02 — End: 2015-11-03
  Administered 2015-11-02: 5 mg via ORAL
  Filled 2015-11-02: qty 1

## 2015-11-02 NOTE — Lactation Note (Signed)
This note was copied from a baby's chart. Lactation Consultation Note New mom plans on pumping and bottle feeding. Until her milk comes in she will be giving formula. Mom has put the baby to the breast, she doesn't like it. Has DEBP at home. Mom shown how to use DEBP & how to disassemble, clean, & reassemble parts.Mom knows to pump q3h for 15-20 min. Discussed supply and demand. Mom encouraged to do skin-to-skin. Educated about newborn behavior, I&O. Referred to Baby and Me Book in Breastfeeding section Pg. 22-23 for position options and Proper latch demonstration. Mom encouraged to feed baby 8-12 times/24 hours and with feeding cues. WH/LC brochure given w/resources, support groups and LC services. Patient Name: Alison Morton Alison Morton Date: 11/02/2015 Reason for consult: Initial assessment   Maternal Data Has patient been taught Hand Expression?: Yes Does the patient have breastfeeding experience prior to this delivery?: No  Feeding Feeding Type: Bottle Fed - Formula Length of feed: 5 min  LATCH Score/Interventions Latch: Repeated attempts needed to sustain latch, nipple held in mouth throughout feeding, stimulation needed to elicit sucking reflex. Intervention(s): Adjust position;Assist with latch;Breast massage  Audible Swallowing: A few with stimulation Intervention(s): Skin to skin;Hand expression  Type of Nipple: Everted at rest and after stimulation  Comfort (Breast/Nipple): Soft / non-tender     Hold (Positioning): Assistance needed to correctly position infant at breast and maintain latch.  LATCH Score: 7  Lactation Tools Discussed/Used WIC Program: Yes Pump Review: Setup, frequency, and cleaning;Milk Storage Initiated by:: Peri JeffersonL. Geddy Boydstun RN IBCLC Date initiated:: 11/02/15   Consult Status Consult Status: Follow-up Date: 11/03/15 Follow-up type: In-patient    [name], Diamond NickelLAURA G 11/02/2015, 3:12 AM

## 2015-11-02 NOTE — Anesthesia Postprocedure Evaluation (Signed)
Anesthesia Post Note  Patient: Alison Morton  Procedure(s) Performed: * No procedures listed *  Patient location during evaluation: Mother Baby Anesthesia Type: Epidural Level of consciousness: awake and alert Pain management: pain level controlled Vital Signs Assessment: post-procedure vital signs reviewed and stable Respiratory status: spontaneous breathing and nonlabored ventilation Cardiovascular status: stable Postop Assessment: no headache, patient able to bend at knees, no backache, no signs of nausea or vomiting, epidural receding and adequate PO intake Anesthetic complications: no     Last Vitals:  Vitals:   11/02/15 0206 11/02/15 0532  BP: 100/60 130/64  Pulse: 78 89  Resp:  18  Temp: 36.6 C 36.9 C    Last Pain:  Vitals:   11/02/15 0828  TempSrc:   PainSc: 4    Pain Goal: Patients Stated Pain Goal: 6 (11/01/15 1141)               Lasonya Hubner Hristova

## 2015-11-02 NOTE — Progress Notes (Signed)
Post Partum Day 1 Subjective: no complaints, up ad lib, voiding, tolerating PO and nl lochia, some pain - written for Oxy IR q 4 hr prn  Objective: Blood pressure 130/64, pulse 89, temperature 98.5 F (36.9 C), temperature source Oral, resp. rate 18, height 5\' 3"  (1.6 m), weight 95.7 kg (211 lb), last menstrual period 02/04/2015, unknown if currently breastfeeding.  Physical Exam:  General: alert and no distress Lochia: appropriate Uterine Fundus: firm   Recent Labs  11/01/15 0745 11/02/15 0613  HGB 12.3 10.9*  HCT 36.5 32.9*    Assessment/Plan: Plan for discharge tomorrow, Breastfeeding and Lactation consult.  Routine care.     LOS: 1 day   Bovard-Stuckert, Kadar Chance 11/02/2015, 8:20 AM

## 2015-11-03 MED ORDER — OXYCODONE HCL 5 MG PO TABS: 5.0000 mg | ORAL_TABLET | ORAL | 0 refills | Status: DC | PRN

## 2015-11-03 MED ORDER — IBUPROFEN 600 MG PO TABS: 600.0000 mg | ORAL_TABLET | Freq: Four times a day (QID) | ORAL | 0 refills | Status: DC

## 2015-11-03 NOTE — Discharge Summary (Signed)
OB Discharge Summary     Patient Name: Alison Morton DOB: 01/09/1991 MRN: [id number]  Date of admission: 11/01/2015 Delivering MD: Pryor Ochoa [facility]   Date of discharge: 11/03/2015  Admitting diagnosis: INDUCTION Intrauterine pregnancy: [redacted]w[redacted]d     Secondary diagnosis:  Active Problems:   Term pregnancy   SVD (spontaneous vaginal delivery)   Postpartum care following vaginal delivery      Discharge diagnosis: Term Pregnancy Delivered                                                                                                 Augmentation: AROM and Pitocin  Complications: None  Hospital course:  Induction of Labor With Vaginal Delivery   25 y.o. yo [id number] at [redacted]w[redacted]d was admitted to the hospital 11/01/2015 for induction of labor.  Indication for induction: Favorable cervix at term.  Patient had an uncomplicated labor course as follows: Membrane Rupture Time/Date: 9:55 AM ,11/01/2015   Intrapartum Procedures: Episiotomy: None [1]                                         Lacerations:  Perineal [11]  Patient had delivery of a Viable infant.  Information for the patient's newborn:  Alison, Morton [[id number]]  Delivery Method: Vaginal, Spontaneous Delivery (Filed from Delivery Summary)   11/01/2015  Details of delivery can be found in separate delivery note.  Patient had a routine postpartum course. Patient is discharged home 11/03/15.   Physical exam Vitals:   11/02/15 0532 11/02/15 1030 11/02/15 1742 11/03/15 0530  BP: 130/64 128/64 111/73 116/73  Pulse: 89 78 88 85  Resp: Temp: 98.5 F (36.9 C) 98.2 F (36.8 C) 98.4 F (36.9 C) 98.4 F (36.9 C)  TempSrc: Oral Oral Oral   Weight:      Height:       General: alert Lochia: appropriate Uterine Fundus: firm  Labs: Lab Results  Component Value Date   WBC 13.8 (H) 11/02/2015   HGB 10.9 (L) 11/02/2015   HCT 32.9 (L) 11/02/2015   MCV 92.2 11/02/2015   PLT 140 (L) 11/02/2015   No flowsheet  data found.  Discharge instruction: per After Visit Summary and "Baby and Me Booklet".  After visit meds:    Medication List    TAKE these medications   ibuprofen 600 MG tablet Commonly known as:  ADVIL,MOTRIN Take 1 tablet (600 mg total) by mouth every 6 (six) hours.   multivitamin-prenatal 27-0.8 MG Tabs tablet Take 1 tablet by mouth daily at 12 noon.   oxyCODONE 5 MG immediate release tablet Commonly known as:  Oxy IR/ROXICODONE Take 1 tablet (5 mg total) by mouth every 4 (four) hours as needed for severe pain.   ranitidine 150 MG tablet Commonly known as:  ZANTAC Take 150 mg by mouth 2 (two) times daily.       Diet: routine diet  Activity: Advance as tolerated. Pelvic rest for 6 weeks.   Outpatient follow up:4  weeks   Newborn Data: Live born female  Birth Weight: 8 lb 1.4 oz (3668 g) APGAR: 9, 9  Baby Feeding: Breast Disposition:home with mother   11/03/2015 Alison Morton,Alison Crutchley D, MD

## 2015-11-03 NOTE — Progress Notes (Signed)
PPD #2 Doing well, some cramping Afeb, VSS Fundus firm D/c home

## 2015-11-03 NOTE — Discharge Instructions (Signed)
As per discharge pamphlet °

## 2016-02-02 ENCOUNTER — Encounter: Payer: Self-pay | Admitting: Sports Medicine

## 2016-02-02 ENCOUNTER — Ambulatory Visit: Payer: Managed Care, Other (non HMO) | Admitting: Sports Medicine

## 2016-02-02 DIAGNOSIS — M79674 Pain in right toe(s): Secondary | ICD-10-CM

## 2016-02-02 DIAGNOSIS — L03031 Cellulitis of right toe: Secondary | ICD-10-CM | POA: Diagnosis not present

## 2016-02-02 MED ORDER — FLUCONAZOLE 100 MG PO TABS: 100.0000 mg | ORAL_TABLET | Freq: Every day | ORAL | 0 refills | Status: DC

## 2016-02-02 MED ORDER — AMOXICILLIN-POT CLAVULANATE 875-125 MG PO TABS: 1.0000 | ORAL_TABLET | Freq: Two times a day (BID) | ORAL | 0 refills | Status: DC

## 2016-02-02 NOTE — Progress Notes (Signed)
Subjective: Alison Morton is a 25 y.o. female patient presents to office today complaining of a painful incurvated, red, hot, swollen lateral nail border of the 1st toe on the right foot. This has been present for > 1 month started after pedicure. Went to Urgent care where they drained and put on antibiotics (Bactrim) with no improvement. Patient has been using post op shoe because of the toe pain. Patient denies fever/chills/nausea/vomitting/any other related constitutional symptoms at this time.  Patient Active Problem List   Diagnosis Date Noted  . Term pregnancy 11/01/2015  . SVD (spontaneous vaginal delivery) 11/01/2015  . Postpartum care following vaginal delivery 11/01/2015  . Abnormal fetal echocardiogram, affecting care of mother, antepartum 06/25/2015  . Fetal arrhythmia affecting pregnancy, antepartum 06/25/2015  . Encounter for Nexplanon removal 08/15/2012    Current Outpatient Prescriptions on File Prior to Visit  Medication Sig Dispense Refill  . ibuprofen (ADVIL,MOTRIN) 600 MG tablet Take 1 tablet (600 mg total) by mouth every 6 (six) hours. 30 tablet 0  . oxyCODONE (OXY IR/ROXICODONE) 5 MG immediate release tablet Take 1 tablet (5 mg total) by mouth every 4 (four) hours as needed for severe pain. 15 tablet 0  . Prenatal Vit-Fe Fumarate-FA (MULTIVITAMIN-PRENATAL) 27-0.8 MG TABS tablet Take 1 tablet by mouth daily at 12 noon.     . ranitidine (ZANTAC) 150 MG tablet Take 150 mg by mouth 2 (two) times daily.     No current facility-administered medications on file prior to visit.     No Known Allergies  Objective:  There were no vitals filed for this visit.  General: Well developed, nourished, in no acute distress, alert and oriented x3   Dermatology: Skin is warm, dry and supple bilateral. Right hallux nail appears to be severely incurvated with hyperkeratosis formation at the distal aspects of lateral nail border. (+) Erythema. (+) Edema. (+) serosanguous   drainage present. The remaining nails appear unremarkable at this time. There are no open sores, lesions or other signs of infection  present.  Vascular: Dorsalis Pedis artery and Posterior Tibial artery pedal pulses are 2/4 bilateral with immedate capillary fill time. Pedal hair growth present. No lower extremity edema.   Neruologic: Grossly intact via light touch bilateral.  Musculoskeletal: Tenderness to palpation of the Right hallux lateral nail fold(s). + Bunions R>L. Muscular strength within normal limits in all groups bilateral.   Assesement and Plan: Problem List Items Addressed This Visit    None    Visit Diagnoses    Paronychia of great toe of right foot    -  Primary   lateral nail border   Relevant Medications   amoxicillin-clavulanate (AUGMENTIN) 875-125 MG tablet   Toe pain, right       Relevant Medications   amoxicillin-clavulanate (AUGMENTIN) 875-125 MG tablet      -Discussed treatment alternatives and plan of care; Explained permanent/temporary nail avulsion and post procedure course to patient. - After a verbal consent, injected 3 ml of a 50:50 mixture of 2% plain lidocaine and 0.5% plain marcaine in a normal hallux block fashion. Next, a betadine prep was performed. Anesthesia was tested and found to be appropriate.  The offending right hallux lateral nail border was then incised from the hyponychium to the epinychium. The offending nail border was removed and cleared from the field. The area was curretted for any remaining nail or spicules. Phenol application performed and the area was then flushed with alcohol and dressed with antibiotic cream and a dry sterile dressing. -  Patient was instructed to leave the dressing intact for today and begin soaking  in a weak solution of betadine and water tomorrow. Patient was instructed to  soak for 15 minutes each day and apply neosporin and a gauze or bandaid dressing each day. -Patient was instructed to monitor the toe for  signs of infection and return to office if toe becomes red, hot or swollen. -Rx Augmentin with Diflucan because history of yeast infections -Dispensed new open toe post op shoe to daily for toe pain -Advised ice, elevation, and tylenol or motrin if needed for pain.  -Patient is to return in 2 weeks for follow up care/nail check or sooner if problems arise. Will also xray at next visit and discuss treatment options for bunions.   Asencion Islamitorya Shekira Drummer, DPM

## 2016-02-02 NOTE — Patient Instructions (Signed)

## 2016-02-16 ENCOUNTER — Ambulatory Visit (INDEPENDENT_AMBULATORY_CARE_PROVIDER_SITE_OTHER): Payer: Managed Care, Other (non HMO) | Admitting: Sports Medicine

## 2016-02-16 ENCOUNTER — Encounter: Payer: Self-pay | Admitting: Sports Medicine

## 2016-02-16 DIAGNOSIS — Z9889 Other specified postprocedural states: Secondary | ICD-10-CM

## 2016-02-16 DIAGNOSIS — L03031 Cellulitis of right toe: Secondary | ICD-10-CM

## 2016-02-16 DIAGNOSIS — M79674 Pain in right toe(s): Secondary | ICD-10-CM

## 2016-02-16 NOTE — Progress Notes (Signed)
Subjective: Alison Morton is a 25 y.o. female patient returns to office today for follow up evaluation after having Right Hallux lateral permanent nail avulsion performed on 02-02-16. Patient has been soaking using betadine and applying topical antibiotic covered with bandaid daily. Patient completed Augmentin deniesfever/chills/nausea/vomitting/any other related constitutional symptoms at this time.  Patient Active Problem List   Diagnosis Date Noted  . Term pregnancy 11/01/2015  . SVD (spontaneous vaginal delivery) 11/01/2015  . Postpartum care following vaginal delivery 11/01/2015  . Abnormal fetal echocardiogram, affecting care of mother, antepartum 06/25/2015  . Fetal arrhythmia affecting pregnancy, antepartum 06/25/2015  . Encounter for Nexplanon removal 08/15/2012    Current Outpatient Prescriptions on File Prior to Visit  Medication Sig Dispense Refill  . amoxicillin-clavulanate (AUGMENTIN) 875-125 MG tablet Take 1 tablet by mouth 2 (two) times daily. 28 tablet 0  . fluconazole (DIFLUCAN) 100 MG tablet Take 1 tablet (100 mg total) by mouth daily. 14 tablet 0  . ibuprofen (ADVIL,MOTRIN) 600 MG tablet Take 1 tablet (600 mg total) by mouth every 6 (six) hours. 30 tablet 0  . oxyCODONE (OXY IR/ROXICODONE) 5 MG immediate release tablet Take 1 tablet (5 mg total) by mouth every 4 (four) hours as needed for severe pain. 15 tablet 0  . Prenatal Vit-Fe Fumarate-FA (MULTIVITAMIN-PRENATAL) 27-0.8 MG TABS tablet Take 1 tablet by mouth daily at 12 noon.     . ranitidine (ZANTAC) 150 MG tablet Take 150 mg by mouth 2 (two) times daily.     No current facility-administered medications on file prior to visit.     No Known Allergies  Objective:  General: Well developed, nourished, in no acute distress, alert and oriented x3   Dermatology: Skin is warm, dry and supple bilateral. Right hallux lateral nail bed appears to be clean, dry, with mild granular tissue and surrounding eschar/scab.  Decreased Erythema. Decreased Edema. No serosanguous drainage present. The remaining nails appear unremarkable at this time. There are no other lesions or other signs of infection  present.  Neurovascular status: Intact. No lower extremity swelling; No pain with calf compression bilateral.  Musculoskeletal: Decreased tenderness to palpation of the Right hallux lateral hallux nail fold(s). R>L Bunion. Muscular strength within normal limits bilateral.   Assesement and Plan: Problem List Items Addressed This Visit    None    Visit Diagnoses    S/P nail surgery    -  Primary   Paronychia of great toe of right foot       Toe pain, right          -Examined patient  -Cleansed right hallux lateral nail fold and gently scrubbed with peroxide and q-tip/curetted away eschar at site and applied antibiotic cream covered with bandaid.  -Discussed plan of care with patient. -Patient to now begin soaking in a weak solution of Epsom salt and warm water. Patient was instructed to soak for 15-20 minutes each day until the toe appears normal and there is no drainage, redness, tenderness, or swelling at the procedure site, and apply neosporin and a gauze or bandaid dressing each day as needed. May leave open to air at night. -Educated patient on long term care after nail surgery. -Patient was instructed to monitor the toe for reoccurrence and signs of infection; Patient advised to return to office or go to ER if toe becomes red, hot or swollen. -Advised patient that we will wait for toe to heal before considering surgery for bunion -Patient is to return in 1 month for xrays  and to discuss R>L bunion and surgery or sooner if problems arise.  Asencion Islamitorya Anastacio Bua, DPM

## 2016-02-16 NOTE — Patient Instructions (Signed)

## 2016-03-15 ENCOUNTER — Encounter: Payer: Self-pay | Admitting: Sports Medicine

## 2016-03-15 ENCOUNTER — Ambulatory Visit: Payer: Managed Care, Other (non HMO)

## 2016-03-15 ENCOUNTER — Ambulatory Visit (INDEPENDENT_AMBULATORY_CARE_PROVIDER_SITE_OTHER): Payer: Managed Care, Other (non HMO) | Admitting: Sports Medicine

## 2016-03-15 ENCOUNTER — Ambulatory Visit (INDEPENDENT_AMBULATORY_CARE_PROVIDER_SITE_OTHER): Payer: Managed Care, Other (non HMO)

## 2016-03-15 DIAGNOSIS — M79671 Pain in right foot: Secondary | ICD-10-CM | POA: Diagnosis not present

## 2016-03-15 DIAGNOSIS — M79672 Pain in left foot: Secondary | ICD-10-CM

## 2016-03-15 DIAGNOSIS — M21619 Bunion of unspecified foot: Secondary | ICD-10-CM | POA: Diagnosis not present

## 2016-03-15 DIAGNOSIS — Z9889 Other specified postprocedural states: Secondary | ICD-10-CM | POA: Diagnosis not present

## 2016-03-15 NOTE — Patient Instructions (Signed)
Pre-Operative Instructions  Congratulations, you have decided to take an important step to improving your quality of life.  You can be assured that the doctors of [facility] will be with you every step of the way.  1. Plan to be at the surgery center/hospital at least 1 (one) hour prior to your scheduled time unless otherwise directed by the surgical center/hospital staff.  You must have a responsible adult accompany you, remain during the surgery and drive you home.  Make sure you have directions to the surgical center/hospital and know how to get there on time. 2. For hospital based surgery you will need to obtain a history and physical form from your family physician within 1 month prior to the date of surgery- we will give you a form for you primary physician.  3. We make every effort to accommodate the date you request for surgery.  There are however, times where surgery dates or times have to be moved.  We will contact you as soon as possible if a change in schedule is required.   4. No Aspirin/Ibuprofen for one week before surgery.  If you are on aspirin, any non-steroidal anti-inflammatory medications (Mobic, Aleve, Ibuprofen) you should stop taking it 7 days prior to your surgery.  You make take Tylenol  For pain prior to surgery.  5. Medications- If you are taking daily heart and blood pressure medications, seizure, reflux, allergy, asthma, anxiety, pain or diabetes medications, make sure the surgery center/hospital is aware before the day of surgery so they may notify you which medications to take or avoid the day of surgery. 6. No food or drink after midnight the night before surgery unless directed otherwise by surgical center/hospital staff. 7. No alcoholic beverages 24 hours prior to surgery.  No smoking 24 hours prior to or 24 hours after surgery. 8. Wear loose pants or shorts- loose enough to fit over bandages, boots, and casts. 9. No slip on shoes, sneakers are best. 10. Bring  your boot with you to the surgery center/hospital.  Also bring crutches or a walker if your physician has prescribed it for you.  If you do not have this equipment, it will be provided for you after surgery. 11. If you have not been contracted by the surgery center/hospital by the day before your surgery, call to confirm the date and time of your surgery. 12. Leave-time from work may vary depending on the type of surgery you have.  Appropriate arrangements should be made prior to surgery with your employer. 13. Prescriptions will be provided immediately following surgery by your doctor.  Have these filled as soon as possible after surgery and take the medication as directed. 14. Remove nail polish on the operative foot. 15. Wash the night before surgery.  The night before surgery wash the foot and leg well with the antibacterial soap provided and water paying special attention to beneath the toenails and in between the toes.  Rinse thoroughly with water and dry well with a towel.  Perform this wash unless told not to do so by your physician.  Enclosed: 1 Ice pack (please put in freezer the night before surgery)   1 Hibiclens skin cleaner   Pre-op Instructions  If you have any questions regarding the instructions, do not hesitate to call our office.  [place]: [address] , [place] [address] [phone]  [place]: [address] [place], [place] [address] [phone]  [place]: [address]  [place], [place] [address] [phone]   Dr.   Norman Regal DPM, Dr. Matthew Wagoner DPM, Dr. M. Todd Hyatt DPM, Dr. Tashaya Ancrum DPM 

## 2016-03-15 NOTE — Progress Notes (Signed)
Subjective: Alison Morton is a 25 y.o. female patient returns to office today for follow up evaluation after having Right Hallux lateral permanent nail avulsion performed on 02-02-16. Patient states feel better but still a little sore at the base and can not wear all shoes yet. Patient is also her to discuss surgery for bunions as well.  Patient Active Problem List   Diagnosis Date Noted  . Term pregnancy 11/01/2015  . SVD (spontaneous vaginal delivery) 11/01/2015  . Postpartum care following vaginal delivery 11/01/2015  . Abnormal fetal echocardiogram, affecting care of mother, antepartum 06/25/2015  . Fetal arrhythmia affecting pregnancy, antepartum 06/25/2015  . Encounter for Nexplanon removal 08/15/2012    Current Outpatient Prescriptions on File Prior to Visit  Medication Sig Dispense Refill  . ibuprofen (ADVIL,MOTRIN) 600 MG tablet Take 1 tablet (600 mg total) by mouth every 6 (six) hours. 30 tablet 0  . Prenatal Vit-Fe Fumarate-FA (MULTIVITAMIN-PRENATAL) 27-0.8 MG TABS tablet Take 1 tablet by mouth daily at 12 noon.     . ranitidine (ZANTAC) 150 MG tablet Take 150 mg by mouth 2 (two) times daily.     No current facility-administered medications on file prior to visit.     No Known Allergies  Objective:  General: Well developed, nourished, in no acute distress, alert and oriented x3   Dermatology: Skin is warm, dry and supple bilateral. Right hallux lateral nail bed appears to be clean, dry, with mild granular tissue and surrounding eschar/scab. No Erythema. No Edema. No serosanguous drainage present. The remaining nails appear unremarkable at this time. There are no other lesions or other signs of infection present.  Neurovascular status: Intact. No lower extremity swelling; No pain with calf compression bilateral.  Musculoskeletal: No tenderness to palpation of the Right hallux lateral hallux nail fold(s). R>L Bunion. Muscular strength within normal limits bilateral.    Xrays, right and left foot- bunion with 1st ray elevatus R>L  Assesement and Plan: Problem List Items Addressed This Visit    None    Visit Diagnoses    Bunion    -  Primary   Pain in both feet       Relevant Orders   DG Foot 2 Views Right   DG Foot 2 Views Left   S/P nail surgery          -Examined patient  -Cleansed right hallux lateral nail fold and gently scrubbed with peroxide and q-tip/curetted away eschar at site and applied antibiotic cream covered with bandaid.  -Re-Discussed plan of care with patient. -Patient to continue soaking in a weak solution of Epsom salt and warm water. Patient was instructed to soak for 15-20 minutes each day until the toe appears normal and there is no drainage, redness, tenderness, or swelling at the procedure site, and apply neosporin and a gauze or bandaid dressing each day as needed. May leave open to air at night. -Educated patient on long term care after nail surgery. -Patient was instructed to monitor the toe for reoccurrence and signs of infection; Patient advised to return to office or go to ER if toe becomes red, hot or swollen. -Patient opt for surgical management. Consent obtained for Right bunionectomy to plantarflex 1st met with screw fixation. Pre and Post op course explained. Risks, benefits, alternatives explained. No guarantees given or implied. Surgical booking slip submitted and provided patient with Surgical packet and info for [facility].  -Dispensed CAM Walker to use post op -Recommend FMLA time 3 months from work post op -Return  to office after surgery  Landis Martins, DPM

## 2016-03-25 ENCOUNTER — Telehealth: Payer: Self-pay | Admitting: *Deleted

## 2016-03-25 NOTE — Telephone Encounter (Signed)
"  I actually have a surgery set up for the 29th of January.  Give me a call back.  I would greatly appreciate it."

## 2016-03-28 NOTE — L&D Delivery Note (Signed)
Delivery Note Pt labored quicky form 5cm to ant lip to complete with urge to push. She pushed twice and at 1:44 PM a viable female was delivered via Vaginal, Spontaneous (Presentation: ROT;  ).  APGAR: 9, 9; weight  pending.   Placenta status: delivered, intact, shultz, .  Cord:3vc  with the following complications: none.  Cord pH: n/a  Anesthesia:  Epidural Episiotomy: None Lacerations:  none Suture Repair: n/a Est. Blood Loss (mL): 350  Mom to OR later today for BTL; consent reverified.  Baby to Couplet care / Skin to Skin.  Cathrine MusterCecilia W Adalea Morton 03/13/2017, 1:57 PM

## 2016-04-05 ENCOUNTER — Other Ambulatory Visit: Payer: Self-pay | Admitting: *Deleted

## 2016-04-05 DIAGNOSIS — G5600 Carpal tunnel syndrome, unspecified upper limb: Secondary | ICD-10-CM

## 2016-04-06 NOTE — Telephone Encounter (Signed)
I attempted to call patient.  She did not answer and I could not leave a message because voicemail was full.

## 2016-04-08 NOTE — Telephone Encounter (Signed)
"  I am calling to cancel my surgery scheduled for January 29."  Would you like to reschedule?  "No, I will call back to reschedule later."  I called and canceled surgery at the surgical center.  I spoke to Marquetteynthia.

## 2016-04-12 ENCOUNTER — Ambulatory Visit (INDEPENDENT_AMBULATORY_CARE_PROVIDER_SITE_OTHER): Payer: Managed Care, Other (non HMO) | Admitting: Neurology

## 2016-04-12 DIAGNOSIS — R202 Paresthesia of skin: Secondary | ICD-10-CM

## 2016-04-12 DIAGNOSIS — G5600 Carpal tunnel syndrome, unspecified upper limb: Secondary | ICD-10-CM | POA: Diagnosis not present

## 2016-04-12 NOTE — Procedures (Signed)
[facility]  [address], [address]  [place], [place] [address] Tel: [phone] Fax:  [phone] Test Date:  04/12/2016  Patient: Alison Morton DOB: [birth year] Physician: Nita Sickleonika Emrah Ariola, DO  Sex: Female Height: 5\' 3"  Ref Phys: Burnell BlanksW. Dan Caffrey, M.D.  ID#: [id number] Temp: 36.3C Technician: Judie PetitM. Dean   Patient Complaints: This is a 26 year old female referred for evaluation of left wrist pain and tingling of the fingers.   NCV & EMG Findings: Extensive electrodiagnostic testing of the left upper extremity shows:  1. Left median, ulnar, and mixed palmar sensory responses are within normal limits. 2. Left median and ulnar motor responses are within normal limits. 3. There is no evidence of active or chronic motor axon loss changes affecting any of the tested muscles. Motor unit configuration and recruitment pattern is within normal limits.  Impression: This is a normal study of the left upper extremity. In particular, there is no evidence of carpal tunnel syndrome or a cervical radiculopathy.   ___________________________ Nita Sickleonika Hosteen Kienast, DO    Nerve Conduction Studies Anti Sensory Summary Table   Site NR Peak (ms) Norm Peak (ms) P-T Amp (V) Norm P-T Amp  Left Median Anti Sensory (2nd Digit)  36.3C  Wrist    2.9 <3.3 52.4 >20  Left Ulnar Anti Sensory (5th Digit)  36.3C  Wrist    2.6 <3.0 29.9 >18   Motor Summary Table   Site NR Onset (ms) Norm Onset (ms) O-P Amp (mV) Norm O-P Amp Site1 Site2 Delta-0 (ms) Dist (cm) Vel (m/s) Norm Vel (m/s)  Left Median Motor (Abd Poll Brev)  36.3C  Wrist    3.0 <3.9 9.4 >6 Elbow Wrist 3.7 24.0 65 >51  Elbow    6.7  8.7         Left Ulnar Motor (Abd Dig Minimi)  36.3C  Wrist    2.4 <3.0 12.4 >8 B Elbow Wrist 3.1 20.0 65 >51  B Elbow    5.5  12.2  A Elbow B Elbow 1.5 10.0 67 >51  A Elbow    7.0  11.9          Comparison Summary Table   Site NR Peak (ms) Norm Peak (ms) P-T Amp (V) Site1 Site2 Delta-P (ms) Norm Delta  (ms)  Left Median/Ulnar Palm Comparison (Wrist - 8cm)  36.3C  Median Palm    1.8 <2.2 104.8 Median Palm Ulnar Palm 0.0   Ulnar Palm    1.8 <2.2 17.0       EMG   Side Muscle Ins Act Fibs Psw Fasc Number Recrt Dur Dur. Amp Amp. Poly Poly. Comment  Left 1stDorInt Nml Nml Nml Nml Nml Nml Nml Nml Nml Nml Nml Nml N/A  Left Ext Indicis Nml Nml Nml Nml Nml Nml Nml Nml Nml Nml Nml Nml N/A  Left PronatorTeres Nml Nml Nml Nml Nml Nml Nml Nml Nml Nml Nml Nml N/A  Left Biceps Nml Nml Nml Nml Nml Nml Nml Nml Nml Nml Nml Nml N/A  Left Triceps Nml Nml Nml Nml Nml Nml Nml Nml Nml Nml Nml Nml N/A  Left Deltoid Nml Nml Nml Nml Nml Nml Nml Nml Nml Nml Nml Nml N/A      Waveforms:

## 2016-06-01 ENCOUNTER — Other Ambulatory Visit: Payer: Self-pay

## 2016-08-05 ENCOUNTER — Inpatient Hospital Stay (HOSPITAL_COMMUNITY): Payer: Managed Care, Other (non HMO)

## 2016-08-05 ENCOUNTER — Inpatient Hospital Stay (HOSPITAL_COMMUNITY)
Admission: AD | Admit: 2016-08-05 | Discharge: 2016-08-05 | Disposition: A | Discharge status: Home / Self Care | Payer: Managed Care, Other (non HMO) | Source: Ambulatory Visit | Attending: Obstetrics and Gynecology | Admitting: Obstetrics and Gynecology

## 2016-08-05 ENCOUNTER — Encounter (HOSPITAL_COMMUNITY): Payer: Self-pay

## 2016-08-05 DIAGNOSIS — Z3A08 8 weeks gestation of pregnancy: Secondary | ICD-10-CM | POA: Insufficient documentation

## 2016-08-05 DIAGNOSIS — O208 Other hemorrhage in early pregnancy: Secondary | ICD-10-CM | POA: Insufficient documentation

## 2016-08-05 DIAGNOSIS — N939 Abnormal uterine and vaginal bleeding, unspecified: Secondary | ICD-10-CM

## 2016-08-05 DIAGNOSIS — O209 Hemorrhage in early pregnancy, unspecified: Secondary | ICD-10-CM | POA: Diagnosis not present

## 2016-08-05 DIAGNOSIS — R51 Headache: Secondary | ICD-10-CM | POA: Insufficient documentation

## 2016-08-05 LAB — CBC
HCT: 38.2 % (ref 36.0–46.0)
Hemoglobin: 12.7 g/dL (ref 12.0–15.0)
MCH: 29.7 pg (ref 26.0–34.0)
MCHC: 33.2 g/dL (ref 30.0–36.0)
MCV: 89.3 fL (ref 78.0–100.0)
PLATELETS: 251 10*3/uL (ref 150–400)
RBC: 4.28 MIL/uL (ref 3.87–5.11)
RDW: 14.2 % (ref 11.5–15.5)
WBC: 10.8 10*3/uL — ABNORMAL HIGH (ref 4.0–10.5)

## 2016-08-05 LAB — URINALYSIS, ROUTINE W REFLEX MICROSCOPIC
Bacteria, UA: NONE SEEN
Bilirubin Urine: NEGATIVE
GLUCOSE, UA: NEGATIVE mg/dL
Ketones, ur: NEGATIVE mg/dL
Nitrite: NEGATIVE
PH: 6 (ref 5.0–8.0)
PROTEIN: NEGATIVE mg/dL
Specific Gravity, Urine: 1.023 (ref 1.005–1.030)

## 2016-08-05 LAB — ABO/RH: ABO/RH(D): O POS

## 2016-08-05 LAB — WET PREP, GENITAL
CLUE CELLS WET PREP: NONE SEEN
Sperm: NONE SEEN
Trich, Wet Prep: NONE SEEN
YEAST WET PREP: NONE SEEN

## 2016-08-05 LAB — HCG, QUANTITATIVE, PREGNANCY: hCG, Beta Chain, Quant, S: 249292 m[IU]/mL — ABNORMAL HIGH (ref <5)

## 2016-08-05 LAB — POCT PREGNANCY, URINE: PREG TEST UR: POSITIVE — AB

## 2016-08-05 MED ORDER — ACETAMINOPHEN 325 MG PO TABS
650.0000 mg | ORAL_TABLET | Freq: Four times a day (QID) | ORAL | Status: DC | PRN
  Administered 2016-08-05: 650 mg via ORAL
  Filled 2016-08-05: qty 2

## 2016-08-05 NOTE — MAU Note (Signed)
Pt c/o brown discharge that started today. Pt c/o slight cramping that started today. Pt c/o severe headache that started today around lunch.

## 2016-08-05 NOTE — MAU Provider Note (Signed)
Patient Alison Morton is a 26 y.o.  [phone]  @ [redacted]w[redacted]d Here with complaints of infrequent brown spotting today and headache. She also has some back pain and infrequent menstrual-like cramps. She denies heavy bleeding, severe abdominal pain or nausea and vomiting. She says that she worked all day today and has not eaten anything.  History     CSN: [id number]  Arrival date and time: 08/05/16 1800   First Provider Initiated Contact with Patient 08/05/16 1847      Chief Complaint  Patient presents with  . Vaginal Bleeding  . Headache   Vaginal Discharge  The patient's primary symptoms include vaginal discharge. The patient's pertinent negatives include no genital itching or genital odor. This is a new problem. The current episode started today. The problem occurs intermittently. The problem has been unchanged. The patient is experiencing no pain. Associated symptoms include abdominal pain, back pain and flank pain. Pertinent negatives include no dysuria or vomiting. Associated symptoms comments: Mild back pain in the lumbar area. The vaginal discharge was bloody. The vaginal bleeding is spotting. She has not been passing clots. She has not been passing tissue. Nothing aggravates the symptoms. She has tried nothing for the symptoms.   She rates her back pain as a 3/10 and her headache as a 7/10. She has had HA problems in the past with her pregnancies but does not remember what she took for them. She has not tried tylenol.  OB History    Gravida Para Term Preterm AB Living   4 2 2   1 2    SAB TAB Ectopic Multiple Live Births     1   0 2      Past Medical History:  Diagnosis Date  . Medical history non-contributory     Past Surgical History:  Procedure Laterality Date  . MOUTH SURGERY      Family History  Problem Relation Age of Onset  . Stroke Maternal Grandfather   . Cancer Maternal Grandfather        breast  . Hypertension Father     Social History  Substance Use Topics   . Smoking status: Never Smoker  . Smokeless tobacco: Never Used  . Alcohol use No    Allergies: No Known Allergies  Prescriptions Prior to Admission  Medication Sig Dispense Refill Last Dose  . Prenatal Vit-Fe Fumarate-FA (MULTIVITAMIN-PRENATAL) 27-0.8 MG TABS tablet Take 1 tablet by mouth daily at 12 noon.    08/05/2016 at Unknown time    Review of Systems  Respiratory: Negative.   Cardiovascular: Negative.   Gastrointestinal: Positive for abdominal pain. Negative for rectal pain and vomiting.  Genitourinary: Positive for flank pain, vaginal bleeding and vaginal discharge. Negative for dyspareunia and dysuria.  Musculoskeletal: Positive for back pain.  Neurological: Negative.   Psychiatric/Behavioral: Negative.    Physical Exam   Blood pressure (!) 143/91, pulse 79, temperature 98.3 F (36.8 C), temperature source Oral, resp. rate 18, height 5\' 3"  (1.6 m), weight 93.8 kg (206 lb 12 oz), last menstrual period 06/09/2016, SpO2 100 %, unknown if currently breastfeeding.  Physical Exam  Constitutional: She is oriented to person, place, and time.  HENT:  Head: Normocephalic.  Neck: Normal range of motion.  Respiratory: Effort normal.  GI: Soft. She exhibits no distension and no mass. There is no tenderness. There is no rebound and no guarding.  Genitourinary:  Genitourinary Comments: NEFG; no lesions on vaginal walls. Scant dark brown blood in the vagina, no lesions on cervix.  No CMT, no suprapubic or adnexal tenderness.   Musculoskeletal: Normal range of motion.  Neurological: She is alert and oriented to person, place, and time.  Skin: Skin is warm and dry.    MAU Course  Procedures  MDM CBC: wnl ABO: O positive  Beta200,000 -UA: normal -GC CT pending -wet prep: normal -Tylenol for HA; HA now completely resolved -US shows SIUP with cardiac activity; small subchorionic hemorrhage    [place] Ob Comp Less 14 Wks  Result Date: 08/05/2016 CLINICAL DATA:  Pregnant patient  in first-trimester pregnancy with vaginal bleeding. EXAM: OBSTETRIC <14 WK US AND TRANSVAGINAL OB US TECHNIQUE: Both transabdominal and transvaginal ultrasound examinations were performed for complete evaluation of the gestation as well as the maternal uterus, adnexal regions, and pelvic cul-de-sac. Transvaginal technique was performed to assess early pregnancy. COMPARISON:  None. FINDINGS: Intrauterine gestational sac: Single Yolk sac:  Visualized. Embryo:  Visualized. Cardiac Activity: Visualized. Heart Rate: 174  bpm CRL:  18.7  mm   8 w   2 d                  US EDC: 03/15/2017 Subchorionic hemorrhage: Small about the fundal aspect of the gestational sac. Maternal uterus/adnexae: Both ovaries are visualized and normal. No pelvic free fluid. No adnexal mass. IMPRESSION: Single live intrauterine pregnancy estimated gestational age [redacted] weeks 2 days for estimated date of delivery 03/15/2017. Small subchorionic hemorrhage. Electronically Signed   By: Rubye OaksMelanie  Ehinger M.D.   On: 08/05/2016 19:54   [place] Ob Transvaginal  Result Date: 08/05/2016 CLINICAL DATA:  Pregnant patient in first-trimester pregnancy with vaginal bleeding. EXAM: OBSTETRIC <14 WK US AND TRANSVAGINAL OB US TECHNIQUE: Both transabdominal and transvaginal ultrasound examinations were performed for complete evaluation of the gestation as well as the maternal uterus, adnexal regions, and pelvic cul-de-sac. Transvaginal technique was performed to assess early pregnancy. COMPARISON:  None. FINDINGS: Intrauterine gestational sac: Single Yolk sac:  Visualized. Embryo:  Visualized. Cardiac Activity: Visualized. Heart Rate: 174  bpm CRL:  18.7  mm   8 w   2 d                  US EDC: 03/15/2017 Subchorionic hemorrhage: Small about the fundal aspect of the gestational sac. Maternal uterus/adnexae: Both ovaries are visualized and normal. No pelvic free fluid. No adnexal mass. IMPRESSION: Single live intrauterine pregnancy estimated gestational age [redacted] weeks 2 days  for estimated date of delivery 03/15/2017. Small subchorionic hemorrhage. Electronically Signed   By: Rubye OaksMelanie  Ehinger M.D.   On: 08/05/2016 19:54    Assessment and Plan   1. Bleeding in early pregnancy   2. Vaginal bleeding    2. Patient stable for discharge. BP normal; HA relieved.  Reviewed labs, US findings and physical exam with Dr. Michiel SitesBovard-Stuckart who agrees with plan of care.  3. Patient to keep her prenatal appointment for 08-12-2016 4. Reviewed bleeding precautions and when to return to the MAU (saturating one pad an hour, abdominal cramping that does not go away with rest and hydration) Charlesetta GaribaldiKathryn Lorraine Kooistra 08/05/2016, 7:32 PM

## 2016-08-05 NOTE — Discharge Instructions (Signed)
Subchorionic Hematoma °A subchorionic hematoma is a gathering of blood between the outer wall of the placenta and the inner wall of the womb (uterus). The placenta is the organ that connects the fetus to the wall of the uterus. The placenta performs the feeding, breathing (oxygen to the fetus), and waste removal (excretory work) of the fetus. °Subchorionic hematoma is the most common abnormality found on a result from ultrasonography done during the first trimester or early second trimester of pregnancy. If there has been little or no vaginal bleeding, early small hematomas usually shrink on their own and do not affect your baby or pregnancy. The blood is gradually absorbed over 1-2 weeks. When bleeding starts later in pregnancy or the hematoma is larger or occurs in an older pregnant woman, the outcome may not be as good. Larger hematomas may get bigger, which increases the chances for miscarriage. Subchorionic hematoma also increases the risk of premature detachment of the placenta from the uterus, preterm (premature) labor, and stillbirth. °Follow these instructions at home: °· Stay on bed rest if your health care provider recommends this. Although bed rest will not prevent more bleeding or prevent a miscarriage, your health care provider may recommend bed rest until you are advised otherwise. °· Avoid heavy lifting (more than 10 lb [4.5 kg]), exercise, sexual intercourse, or douching as directed by your health care provider. °· Keep track of the number of pads you use each day and how soaked (saturated) they are. Write down this information. °· Do not use tampons. °· Keep all follow-up appointments as directed by your health care provider. Your health care provider may ask you to have follow-up blood tests or ultrasound tests or both. °Get help right away if: °· You have severe cramps in your stomach, back, abdomen, or pelvis. °· You have a fever. °· You pass large clots or tissue. Save any tissue for your  health care provider to look at. °· Your bleeding increases or you become lightheaded, feel weak, or have fainting episodes. °This information is not intended to replace advice given to you by your health care provider. Make sure you discuss any questions you have with your health care provider. °Document Released: 06/29/2006 Document Revised: 08/20/2015 Document Reviewed: 10/11/2012 °Elsevier Interactive Patient Education © 2017 [employer] ° °

## 2016-08-08 LAB — GC/CHLAMYDIA PROBE AMP (~~LOC~~) NOT AT ARMC
Chlamydia: NEGATIVE
NEISSERIA GONORRHEA: NEGATIVE

## 2016-08-12 LAB — OB RESULTS CONSOLE ABO/RH: RH TYPE: POSITIVE

## 2016-08-12 LAB — OB RESULTS CONSOLE GC/CHLAMYDIA
Chlamydia: NEGATIVE
GC PROBE AMP, GENITAL: NEGATIVE

## 2016-08-12 LAB — OB RESULTS CONSOLE RPR: RPR: NONREACTIVE

## 2016-08-12 LAB — OB RESULTS CONSOLE HEPATITIS B SURFACE ANTIGEN: HEP B S AG: NEGATIVE

## 2016-08-12 LAB — OB RESULTS CONSOLE HIV ANTIBODY (ROUTINE TESTING): HIV: NONREACTIVE

## 2016-08-12 LAB — OB RESULTS CONSOLE RUBELLA ANTIBODY, IGM: RUBELLA: IMMUNE

## 2016-08-12 LAB — OB RESULTS CONSOLE ANTIBODY SCREEN: Antibody Screen: NEGATIVE

## 2017-02-28 LAB — OB RESULTS CONSOLE GBS: STREP GROUP B AG: NEGATIVE

## 2017-03-02 ENCOUNTER — Telehealth (HOSPITAL_COMMUNITY): Payer: Self-pay | Admitting: *Deleted

## 2017-03-02 ENCOUNTER — Encounter (HOSPITAL_COMMUNITY): Payer: Self-pay | Admitting: *Deleted

## 2017-03-02 NOTE — Telephone Encounter (Signed)
Preadmission screen  

## 2017-03-13 ENCOUNTER — Encounter (HOSPITAL_COMMUNITY): Payer: Self-pay

## 2017-03-13 ENCOUNTER — Inpatient Hospital Stay (HOSPITAL_COMMUNITY)
Admission: RE | Admit: 2017-03-13 | Discharge: 2017-03-15 | DRG: 798 | Disposition: A | Discharge status: Home / Self Care | Payer: Managed Care, Other (non HMO) | Source: Ambulatory Visit | Attending: Obstetrics and Gynecology | Admitting: Obstetrics and Gynecology

## 2017-03-13 ENCOUNTER — Inpatient Hospital Stay (HOSPITAL_COMMUNITY): Payer: Managed Care, Other (non HMO) | Admitting: Anesthesiology

## 2017-03-13 DIAGNOSIS — Z302 Encounter for sterilization: Secondary | ICD-10-CM

## 2017-03-13 DIAGNOSIS — Z3A39 39 weeks gestation of pregnancy: Secondary | ICD-10-CM | POA: Diagnosis not present

## 2017-03-13 DIAGNOSIS — Z3483 Encounter for supervision of other normal pregnancy, third trimester: Secondary | ICD-10-CM | POA: Diagnosis present

## 2017-03-13 DIAGNOSIS — Z3490 Encounter for supervision of normal pregnancy, unspecified, unspecified trimester: Secondary | ICD-10-CM

## 2017-03-13 LAB — CBC
HCT: 37.4 % (ref 36.0–46.0)
Hemoglobin: 12 g/dL (ref 12.0–15.0)
MCH: 29.6 pg (ref 26.0–34.0)
MCHC: 32.1 g/dL (ref 30.0–36.0)
MCV: 92.3 fL (ref 78.0–100.0)
PLATELETS: 166 10*3/uL (ref 150–400)
RBC: 4.05 MIL/uL (ref 3.87–5.11)
RDW: 14.5 % (ref 11.5–15.5)
WBC: 7.2 10*3/uL (ref 4.0–10.5)

## 2017-03-13 LAB — TYPE AND SCREEN
ABO/RH(D): O POS
ANTIBODY SCREEN: NEGATIVE

## 2017-03-13 LAB — RPR: RPR Ser Ql: NONREACTIVE

## 2017-03-13 MED ORDER — OXYTOCIN 40 UNITS IN LACTATED RINGERS INFUSION - SIMPLE MED
1.0000 m[IU]/min | INTRAVENOUS | Status: DC
  Administered 2017-03-13: 2 m[IU]/min via INTRAVENOUS
  Filled 2017-03-13: qty 1000

## 2017-03-13 MED ORDER — TETANUS-DIPHTH-ACELL PERTUSSIS 5-2.5-18.5 LF-MCG/0.5 IM SUSP: 0.5000 mL | Freq: Once | INTRAMUSCULAR | Status: DC

## 2017-03-13 MED ORDER — OXYTOCIN 40 UNITS IN LACTATED RINGERS INFUSION - SIMPLE MED: 2.5000 [IU]/h | INTRAVENOUS | Status: DC

## 2017-03-13 MED ORDER — DIBUCAINE 1 % RE OINT: 1.0000 "application " | TOPICAL_OINTMENT | RECTAL | Status: DC | PRN

## 2017-03-13 MED ORDER — IBUPROFEN 600 MG PO TABS
600.0000 mg | ORAL_TABLET | Freq: Four times a day (QID) | ORAL | Status: DC
  Administered 2017-03-13 – 2017-03-15 (×8): 600 mg via ORAL
  Filled 2017-03-13 (×8): qty 1

## 2017-03-13 MED ORDER — WITCH HAZEL-GLYCERIN EX PADS: 1.0000 "application " | MEDICATED_PAD | CUTANEOUS | Status: DC | PRN

## 2017-03-13 MED ORDER — ONDANSETRON HCL 4 MG/2ML IJ SOLN: 4.0000 mg | Freq: Four times a day (QID) | INTRAMUSCULAR | Status: DC | PRN

## 2017-03-13 MED ORDER — LIDOCAINE HCL (PF) 1 % IJ SOLN
30.0000 mL | INTRAMUSCULAR | Status: DC | PRN
  Filled 2017-03-13: qty 30

## 2017-03-13 MED ORDER — LACTATED RINGERS IV SOLN
INTRAVENOUS | Status: DC
  Administered 2017-03-14 (×2): via INTRAVENOUS

## 2017-03-13 MED ORDER — FENTANYL 2.5 MCG/ML BUPIVACAINE 1/10 % EPIDURAL INFUSION (WH - ANES)
14.0000 mL/h | INTRAMUSCULAR | Status: DC | PRN
  Administered 2017-03-13 (×2): 14 mL/h via EPIDURAL
  Filled 2017-03-13: qty 100

## 2017-03-13 MED ORDER — ZOLPIDEM TARTRATE 5 MG PO TABS: 5.0000 mg | ORAL_TABLET | Freq: Every evening | ORAL | Status: DC | PRN

## 2017-03-13 MED ORDER — ONDANSETRON HCL 4 MG/2ML IJ SOLN: 4.0000 mg | INTRAMUSCULAR | Status: DC | PRN

## 2017-03-13 MED ORDER — SOD CITRATE-CITRIC ACID 500-334 MG/5ML PO SOLN: 30.0000 mL | ORAL | Status: DC | PRN

## 2017-03-13 MED ORDER — PHENYLEPHRINE 40 MCG/ML (10ML) SYRINGE FOR IV PUSH (FOR BLOOD PRESSURE SUPPORT)
80.0000 ug | PREFILLED_SYRINGE | INTRAVENOUS | Status: DC | PRN
  Filled 2017-03-13: qty 5

## 2017-03-13 MED ORDER — OXYTOCIN BOLUS FROM INFUSION: 500.0000 mL | Freq: Once | INTRAVENOUS | Status: DC

## 2017-03-13 MED ORDER — METOCLOPRAMIDE HCL 10 MG PO TABS
10.0000 mg | ORAL_TABLET | Freq: Once | ORAL | Status: AC
  Administered 2017-03-14: 10 mg via ORAL
  Filled 2017-03-13: qty 1

## 2017-03-13 MED ORDER — COCONUT OIL OIL: 1.0000 "application " | TOPICAL_OIL | Status: DC | PRN

## 2017-03-13 MED ORDER — SALINE SPRAY 0.65 % NA SOLN
1.0000 | Freq: Three times a day (TID) | NASAL | Status: DC
  Administered 2017-03-13 – 2017-03-15 (×2): 1 via NASAL
  Filled 2017-03-13: qty 44

## 2017-03-13 MED ORDER — EPHEDRINE 5 MG/ML INJ
10.0000 mg | INTRAVENOUS | Status: DC | PRN
  Filled 2017-03-13: qty 2

## 2017-03-13 MED ORDER — OXYCODONE-ACETAMINOPHEN 5-325 MG PO TABS: 2.0000 | ORAL_TABLET | ORAL | Status: DC | PRN

## 2017-03-13 MED ORDER — SENNOSIDES-DOCUSATE SODIUM 8.6-50 MG PO TABS
2.0000 | ORAL_TABLET | ORAL | Status: DC
  Administered 2017-03-13 – 2017-03-14 (×2): 2 via ORAL
  Filled 2017-03-13 (×2): qty 2

## 2017-03-13 MED ORDER — OXYCODONE HCL 5 MG PO TABS
5.0000 mg | ORAL_TABLET | ORAL | Status: DC | PRN
  Administered 2017-03-14 – 2017-03-15 (×3): 5 mg via ORAL
  Filled 2017-03-13 (×3): qty 1

## 2017-03-13 MED ORDER — DIPHENHYDRAMINE HCL 25 MG PO CAPS: 25.0000 mg | ORAL_CAPSULE | Freq: Four times a day (QID) | ORAL | Status: DC | PRN

## 2017-03-13 MED ORDER — GUAIFENESIN 100 MG/5ML PO SOLN
15.0000 mL | Freq: Four times a day (QID) | ORAL | Status: DC
  Administered 2017-03-13 (×2): 300 mg via ORAL
  Filled 2017-03-13 (×2): qty 15

## 2017-03-13 MED ORDER — LACTATED RINGERS IV SOLN
INTRAVENOUS | Status: DC
  Administered 2017-03-13: 10:00:00 via INTRAVENOUS

## 2017-03-13 MED ORDER — PSEUDOEPHEDRINE HCL 30 MG PO TABS
30.0000 mg | ORAL_TABLET | Freq: Once | ORAL | Status: AC
  Administered 2017-03-13: 30 mg via ORAL
  Filled 2017-03-13: qty 1

## 2017-03-13 MED ORDER — LIDOCAINE HCL (PF) 1 % IJ SOLN
INTRAMUSCULAR | Status: DC | PRN
  Administered 2017-03-13: 6 mL via EPIDURAL
  Administered 2017-03-13: 4 mL

## 2017-03-13 MED ORDER — BENZOCAINE-MENTHOL 20-0.5 % EX AERO
1.0000 "application " | INHALATION_SPRAY | CUTANEOUS | Status: DC | PRN
  Filled 2017-03-13: qty 56

## 2017-03-13 MED ORDER — ONDANSETRON HCL 4 MG PO TABS: 4.0000 mg | ORAL_TABLET | ORAL | Status: DC | PRN

## 2017-03-13 MED ORDER — FAMOTIDINE 20 MG PO TABS
40.0000 mg | ORAL_TABLET | Freq: Once | ORAL | Status: AC
  Administered 2017-03-14: 40 mg via ORAL
  Filled 2017-03-13: qty 2

## 2017-03-13 MED ORDER — OXYCODONE HCL 5 MG PO TABS
10.0000 mg | ORAL_TABLET | ORAL | Status: DC | PRN
  Administered 2017-03-14: 10 mg via ORAL
  Filled 2017-03-13: qty 2

## 2017-03-13 MED ORDER — PRENATAL MULTIVITAMIN CH
1.0000 | ORAL_TABLET | Freq: Every day | ORAL | Status: DC
  Administered 2017-03-14: 1 via ORAL
  Filled 2017-03-13: qty 1

## 2017-03-13 MED ORDER — ACETAMINOPHEN 325 MG PO TABS: 650.0000 mg | ORAL_TABLET | ORAL | Status: DC | PRN

## 2017-03-13 MED ORDER — TERBUTALINE SULFATE 1 MG/ML IJ SOLN
0.2500 mg | Freq: Once | INTRAMUSCULAR | Status: DC | PRN
  Filled 2017-03-13: qty 1

## 2017-03-13 MED ORDER — SIMETHICONE 80 MG PO CHEW: 80.0000 mg | CHEWABLE_TABLET | ORAL | Status: DC | PRN

## 2017-03-13 MED ORDER — LACTATED RINGERS IV SOLN
500.0000 mL | Freq: Once | INTRAVENOUS | Status: AC
  Administered 2017-03-13: 500 mL via INTRAVENOUS

## 2017-03-13 MED ORDER — OXYCODONE-ACETAMINOPHEN 5-325 MG PO TABS: 1.0000 | ORAL_TABLET | ORAL | Status: DC | PRN

## 2017-03-13 MED ORDER — DIPHENHYDRAMINE HCL 50 MG/ML IJ SOLN: 12.5000 mg | INTRAMUSCULAR | Status: DC | PRN

## 2017-03-13 MED ORDER — LACTATED RINGERS IV SOLN: 500.0000 mL | INTRAVENOUS | Status: DC | PRN

## 2017-03-13 MED ORDER — ACETAMINOPHEN 325 MG PO TABS
650.0000 mg | ORAL_TABLET | ORAL | Status: DC | PRN
  Administered 2017-03-14 – 2017-03-15 (×3): 650 mg via ORAL
  Filled 2017-03-13 (×3): qty 2

## 2017-03-13 MED ORDER — PHENYLEPHRINE 40 MCG/ML (10ML) SYRINGE FOR IV PUSH (FOR BLOOD PRESSURE SUPPORT)
80.0000 ug | PREFILLED_SYRINGE | INTRAVENOUS | Status: DC | PRN
  Filled 2017-03-13: qty 10
  Filled 2017-03-13: qty 5

## 2017-03-13 NOTE — Anesthesia Procedure Notes (Addendum)
Epidural Patient location during procedure: OB  Staffing Anesthesiologist: Cristela BlueJackson, Laquanta Hummel, MD  Preanesthetic Checklist Completed: patient identified, pre-op evaluation, timeout performed, IV checked, risks and benefits discussed and monitors and equipment checked  Epidural Patient position: sitting Prep: DuraPrep Patient monitoring: blood pressure and continuous pulse ox Approach: right paramedian Location: L3-L4 Injection technique: LOR air  Needle:  Needle type: Tuohy  Needle gauge: 17 G Needle insertion depth: 8 cm Catheter type: closed end flexible Catheter size: 19 Gauge Catheter at skin depth: 15 cm Test dose: negative  Assessment Sensory level: T8  Additional Notes    Dosing of Epidural:  1st dose, through catheter ............................................Marland Kitchen.  Xylocaine 40 mg  2nd dose, through catheter, after waiting 3 minutes........Marland Kitchen.Xylocaine 60 mg    As each dose occurred, patient was free of IV sx; and patient exhibited no evidence of SA injection.  Patient is more comfortable after epidural dosed. Please see RN's note for documentation of vital signs,and FHR which are stable.  Patient reminded not to try to ambulate with numb legs, and that an RN must be present when she attempts to get up.

## 2017-03-13 NOTE — Anesthesia Pain Management Evaluation Note (Signed)
  CRNA Pain Management Visit Note  Patient: Alison Morton, 26 y.o., female  "Hello I am a member of the anesthesia team at [facility]. We have an anesthesia team available at all times to provide care throughout the hospital, including epidural management and anesthesia for C-section. I don't know your plan for the delivery whether it a natural birth, water birth, IV sedation, nitrous supplementation, doula or epidural, but we want to meet your pain goals."   1.Was your pain managed to your expectations on prior hospitalizations?   Yes   2.What is your expectation for pain management during this hospitalization?     Epidural  3.How can we help you reach that goal? epidural  Record the patient's initial score and the patient's pain goal.   Pain: 0  Pain Goal: 5 [facility] wants you to be able to say your pain was always managed very well.  Ashanty Coltrane 03/13/2017

## 2017-03-13 NOTE — H&P (View-Only) (Signed)
Alison Morton is a 26 y.o. [id number] at 6527w4d by LMP admitted for induction of labor due to Elective at term.  Subjective: Increased pelvic pressure. On peanut. No HA or blurry vision  Objective: BP 121/71   Pulse 82   Temp 98.4 F (36.9 C) (Oral)   Resp 18   Ht 5\' 3"  (1.6 m)   Wt 218 lb (98.9 kg)   LMP 06/09/2016 (Exact Date)   BMI 38.62 kg/m  No intake/output data recorded. No intake/output data recorded.  FHT:  FHR: 135 bpm, variability: moderate,  accelerations:  Present,  decelerations:  Present early UC:   regular, every 3 minutes SVE:   Dilation: 5 Effacement (%): 80 Station: -2 Exam by:: Alison Plateravis, RN  Labs: Lab Results  Component Value Date   WBC 7.2 03/13/2017   HGB 12.0 03/13/2017   HCT 37.4 03/13/2017   MCV 92.3 03/13/2017   PLT 166 03/13/2017    Assessment / Plan: Induction of labor due to term with favorable cervix,  progressing well on pitocin  Labor: Progressing normally; AROM, pit per protocol Preeclampsia:  labs stable Fetal Wellbeing:  Category I Pain Control:  Epidural I/D:  n/a Anticipated MOD:  NSVD  Alison Morton 03/13/2017, 12:41 PM

## 2017-03-13 NOTE — H&P (Signed)
Andrey Cotaaqueshia J Laubach is a 26 y.o. female presenting for elective iol. Prenatal care relatively uncomplicated except for backpain. Pregnancy dated per LMp and confirmed with first trimester US. Panorama low risk. GBS neg OB History    Gravida Para Term Preterm AB Living   4 2 2   1 2    SAB TAB Ectopic Multiple Live Births     1   0 2     Past Medical History:  Diagnosis Date  . Medical history non-contributory    Past Surgical History:  Procedure Laterality Date  . CARPAL TUNNEL RELEASE    . GANGLION CYST EXCISION    . MOUTH SURGERY     Family History: family history includes Cancer in her maternal grandfather; Hypertension in her father; Stroke in her maternal grandfather. Social History:  reports that  has never smoked. she has never used smokeless tobacco. She reports that she does not drink alcohol or use drugs.     Maternal Diabetes: No Genetic Screening: Normal Maternal Ultrasounds/Referrals: Normal Fetal Ultrasounds or other Referrals:  None Maternal Substance Abuse:  No Significant Maternal Medications:  None Significant Maternal Lab Results:  Lab values include: Group B Strep negative Other Comments:  None  ROS History Dilation: 4 Effacement (%): 60 Station: -2 Exam by:: A.Davis, RN  Blood pressure (!) 134/91, pulse 93, temperature 98.4 F (36.9 C), temperature source Oral, resp. rate 16, height 5\' 3"  (1.6 m), weight 218 lb (98.9 kg), last menstrual period 06/09/2016, unknown if currently breastfeeding. Exam Physical Exam  Prenatal labs: ABO, Rh: O/Positive/-- (05/18 0000) Antibody: Negative (05/18 0000) Rubella: Immune (05/18 0000) RPR: Nonreactive (05/18 0000)  HBsAg: Negative (05/18 0000)  HIV: Non-reactive (05/18 0000)  GBS: Negative (12/04 0000)   Assessment/Plan: Multip at 39 4/[redacted]wks gestation for elective iol Pit, epidural then AROM Expectant mgmt Anticipate svd   Anyssa Sharpless W Beryle Zeitz 03/13/2017, 9:50 AM

## 2017-03-13 NOTE — Progress Notes (Signed)
Alison Morton is a 26 y.o. G4P2012 at [redacted]w[redacted]d by LMP admitted for induction of labor due to Elective at term.  Subjective: Increased pelvic pressure. On peanut. No HA or blurry vision  Objective: BP 121/71   Pulse 82   Temp 98.4 F (36.9 C) (Oral)   Resp 18   Ht 5' 3" (1.6 m)   Wt 218 lb (98.9 kg)   LMP 06/09/2016 (Exact Date)   BMI 38.62 kg/m  No intake/output data recorded. No intake/output data recorded.  FHT:  FHR: 135 bpm, variability: moderate,  accelerations:  Present,  decelerations:  Present early UC:   regular, every 3 minutes SVE:   Dilation: 5 Effacement (%): 80 Station: -2 Exam by:: Davis, RN  Labs: Lab Results  Component Value Date   WBC 7.2 03/13/2017   HGB 12.0 03/13/2017   HCT 37.4 03/13/2017   MCV 92.3 03/13/2017   PLT 166 03/13/2017    Assessment / Plan: Induction of labor due to term with favorable cervix,  progressing well on pitocin  Labor: Progressing normally; AROM, pit per protocol Preeclampsia:  labs stable Fetal Wellbeing:  Category I Pain Control:  Epidural I/D:  n/a Anticipated MOD:  NSVD  Cecilia W Banga 03/13/2017, 12:41 PM  

## 2017-03-13 NOTE — Anesthesia Preprocedure Evaluation (Signed)
Anesthesia Evaluation  Patient identified by MRN, date of birth, ID band Patient awake    Reviewed: Allergy & Precautions, H&P , Patient's Chart, lab work & pertinent test results  Airway Mallampati: II  TM Distance: >3 FB Neck ROM: full    Dental  (+) Teeth Intact   Pulmonary    breath sounds clear to auscultation       Cardiovascular  Rhythm:regular Rate:Normal     Neuro/Psych    GI/Hepatic   Endo/Other    Renal/GU      Musculoskeletal   Abdominal   Peds  Hematology   Anesthesia Other Findings       Reproductive/Obstetrics (+) Pregnancy                             Anesthesia Physical Anesthesia Plan  ASA: III  Anesthesia Plan: Epidural   Post-op Pain Management:    Induction:   PONV Risk Score and Plan: Treatment may vary due to age or medical condition  Airway Management Planned:   Additional Equipment:   Intra-op Plan:   Post-operative Plan:   Informed Consent: I have reviewed the patients History and Physical, chart, labs and discussed the procedure including the risks, benefits and alternatives for the proposed anesthesia with the patient or authorized representative who has indicated his/her understanding and acceptance.   Dental Advisory Given  Plan Discussed with:   Anesthesia Plan Comments: (Labs checked- platelets confirmed with RN in room. Fetal heart tracing, per RN, reported to be stable enough for sitting procedure. Discussed epidural, and patient consents to the procedure:  included risk of possible headache,backache, failed block, allergic reaction, and nerve injury. This patient was asked if she had any questions or concerns before the procedure started.)        Anesthesia Quick Evaluation

## 2017-03-13 NOTE — Anesthesia Postprocedure Evaluation (Signed)
Anesthesia Post Note  Patient: Alison Morton  Procedure(s) Performed: AN AD HOC LABOR EPIDURAL     Patient location during evaluation: Mother Baby Anesthesia Type: Epidural Level of consciousness: awake Pain management: pain level controlled Vital Signs Assessment: post-procedure vital signs reviewed and stable Respiratory status: spontaneous breathing Cardiovascular status: stable Postop Assessment: no headache, no backache, epidural receding, patient able to bend at knees, no apparent nausea or vomiting and adequate PO intake Anesthetic complications: no    Last Vitals:  Vitals:   03/13/17 1530 03/13/17 1630  BP: 131/76 132/77  Pulse: 94 88  Resp: 17 18  Temp: 37.1 C   SpO2: 100% 98%    Last Pain:  Vitals:   03/13/17 1530  TempSrc: Oral  PainSc:    Pain Goal:                 [name]

## 2017-03-14 ENCOUNTER — Encounter (HOSPITAL_COMMUNITY): Payer: Self-pay | Admitting: Obstetrics and Gynecology

## 2017-03-14 ENCOUNTER — Encounter (HOSPITAL_COMMUNITY)
Admission: RE | Disposition: A | Discharge status: Home / Self Care | Payer: Self-pay | Source: Ambulatory Visit | Attending: Obstetrics and Gynecology

## 2017-03-14 ENCOUNTER — Inpatient Hospital Stay (HOSPITAL_COMMUNITY): Payer: Managed Care, Other (non HMO) | Admitting: Certified Registered Nurse Anesthetist

## 2017-03-14 HISTORY — PX: TUBAL LIGATION: SHX77

## 2017-03-14 LAB — CBC
HEMATOCRIT: 31.6 % — AB (ref 36.0–46.0)
Hemoglobin: 10.2 g/dL — ABNORMAL LOW (ref 12.0–15.0)
MCH: 30.2 pg (ref 26.0–34.0)
MCHC: 32.3 g/dL (ref 30.0–36.0)
MCV: 93.5 fL (ref 78.0–100.0)
PLATELETS: 145 10*3/uL — AB (ref 150–400)
RBC: 3.38 MIL/uL — AB (ref 3.87–5.11)
RDW: 14.8 % (ref 11.5–15.5)
WBC: 9.5 10*3/uL (ref 4.0–10.5)

## 2017-03-14 SURGERY — LIGATION, FALLOPIAN TUBE, POSTPARTUM
Anesthesia: Epidural | Site: Abdomen | Laterality: Bilateral | Wound class: Clean Contaminated

## 2017-03-14 MED ORDER — MIDAZOLAM HCL 2 MG/2ML IJ SOLN
INTRAMUSCULAR | Status: DC | PRN
  Administered 2017-03-14: 2 mg via INTRAVENOUS

## 2017-03-14 MED ORDER — LIDOCAINE HCL 1 % IJ SOLN
INTRAMUSCULAR | Status: AC
  Filled 2017-03-14: qty 20

## 2017-03-14 MED ORDER — FENTANYL CITRATE (PF) 100 MCG/2ML IJ SOLN
INTRAMUSCULAR | Status: DC | PRN
  Administered 2017-03-14: 100 ug via INTRAVENOUS

## 2017-03-14 MED ORDER — MIDAZOLAM HCL 2 MG/2ML IJ SOLN
INTRAMUSCULAR | Status: AC
  Filled 2017-03-14: qty 2

## 2017-03-14 MED ORDER — FENTANYL CITRATE (PF) 100 MCG/2ML IJ SOLN
INTRAMUSCULAR | Status: AC
  Filled 2017-03-14: qty 2

## 2017-03-14 MED ORDER — OXYMETAZOLINE HCL 0.05 % NA SOLN
1.0000 | Freq: Two times a day (BID) | NASAL | Status: DC | PRN
  Filled 2017-03-14: qty 15

## 2017-03-14 MED ORDER — ONDANSETRON HCL 4 MG/2ML IJ SOLN
INTRAMUSCULAR | Status: DC | PRN
  Administered 2017-03-14: 4 mg via INTRAVENOUS

## 2017-03-14 MED ORDER — SODIUM BICARBONATE 8.4 % IV SOLN
INTRAVENOUS | Status: DC | PRN
  Administered 2017-03-14 (×4): 5 mL via EPIDURAL

## 2017-03-14 MED ORDER — LIDOCAINE-EPINEPHRINE (PF) 2 %-1:200000 IJ SOLN
INTRAMUSCULAR | Status: AC
  Filled 2017-03-14: qty 20

## 2017-03-14 MED ORDER — MEPERIDINE HCL 25 MG/ML IJ SOLN: 6.2500 mg | INTRAMUSCULAR | Status: DC | PRN

## 2017-03-14 MED ORDER — FENTANYL CITRATE (PF) 100 MCG/2ML IJ SOLN
25.0000 ug | INTRAMUSCULAR | Status: DC | PRN
  Administered 2017-03-14 (×2): 50 ug via INTRAVENOUS

## 2017-03-14 MED ORDER — ONDANSETRON HCL 4 MG/2ML IJ SOLN
INTRAMUSCULAR | Status: AC
  Filled 2017-03-14: qty 2

## 2017-03-14 MED ORDER — SODIUM CHLORIDE 0.9 % IR SOLN
Status: DC | PRN
  Administered 2017-03-14: 1

## 2017-03-14 MED ORDER — ONDANSETRON HCL 4 MG/2ML IJ SOLN: 4.0000 mg | Freq: Once | INTRAMUSCULAR | Status: DC | PRN

## 2017-03-14 MED ORDER — LORATADINE 10 MG PO TABS
10.0000 mg | ORAL_TABLET | Freq: Every day | ORAL | Status: DC
  Administered 2017-03-14 – 2017-03-15 (×2): 10 mg via ORAL
  Filled 2017-03-14 (×2): qty 1

## 2017-03-14 MED ORDER — LIDOCAINE HCL (PF) 1 % IJ SOLN
INTRAMUSCULAR | Status: DC | PRN
  Administered 2017-03-14: 10 mL

## 2017-03-14 MED ORDER — SODIUM BICARBONATE 8.4 % IV SOLN
INTRAVENOUS | Status: AC
  Filled 2017-03-14: qty 50

## 2017-03-14 SURGICAL SUPPLY — 31 items
APL SKNCLS STERI-STRIP NONHPOA (GAUZE/BANDAGES/DRESSINGS) ×1
BENZOIN TINCTURE PRP APPL 2/3 (GAUZE/BANDAGES/DRESSINGS) ×3 IMPLANT
BLADE SURG 11 STRL SS (BLADE) ×3 IMPLANT
CLOSURE STERI STRIP 1/2 X4 (GAUZE/BANDAGES/DRESSINGS) ×2 IMPLANT
CLOTH BEACON ORANGE TIMEOUT ST (SAFETY) ×3 IMPLANT
CONTAINER PREFILL 10% NBF 15ML (MISCELLANEOUS) ×3 IMPLANT
DRSG OPSITE POSTOP 3X4 (GAUZE/BANDAGES/DRESSINGS) ×3 IMPLANT
DURAPREP 26ML APPLICATOR (WOUND CARE) ×3 IMPLANT
ELECT REM PT RETURN 9FT ADLT (ELECTROSURGICAL) ×3
ELECTRODE REM PT RTRN 9FT ADLT (ELECTROSURGICAL) ×1 IMPLANT
GLOVE BIO SURGEON STRL SZ 6.5 (GLOVE) ×2 IMPLANT
GLOVE BIO SURGEONS STRL SZ 6.5 (GLOVE) ×1
GLOVE BIOGEL PI IND STRL 7.0 (GLOVE) ×1 IMPLANT
GLOVE BIOGEL PI INDICATOR 7.0 (GLOVE) ×2
GOWN STRL REUS W/TWL LRG LVL3 (GOWN DISPOSABLE) ×6 IMPLANT
NEEDLE HYPO 22GX1.5 SAFETY (NEEDLE) IMPLANT
NS IRRIG 1000ML POUR BTL (IV SOLUTION) ×3 IMPLANT
PACK ABDOMINAL MINOR (CUSTOM PROCEDURE TRAY) ×3 IMPLANT
PENCIL BUTTON HOLSTER BLD 10FT (ELECTRODE) IMPLANT
PROTECTOR NERVE ULNAR (MISCELLANEOUS) ×3 IMPLANT
SPONGE LAP 4X18 X RAY DECT (DISPOSABLE) ×3 IMPLANT
STRIP CLOSURE SKIN 1/2X4 (GAUZE/BANDAGES/DRESSINGS) ×2 IMPLANT
SUT PLAIN 0 NONE (SUTURE) ×3 IMPLANT
SUT VIC AB 2-0 CT1 27 (SUTURE) ×3
SUT VIC AB 2-0 CT1 TAPERPNT 27 (SUTURE) ×1 IMPLANT
SUT VIC AB 2-0 SH 27 (SUTURE)
SUT VIC AB 2-0 SH 27XBRD (SUTURE) IMPLANT
SUT VIC AB 3-0 FS2 27 (SUTURE) ×3 IMPLANT
SYR CONTROL 10ML LL (SYRINGE) IMPLANT
TOWEL OR 17X24 6PK STRL BLUE (TOWEL DISPOSABLE) ×6 IMPLANT
TRAY FOLEY CATH SILVER 14FR (SET/KITS/TRAYS/PACK) ×3 IMPLANT

## 2017-03-14 NOTE — Anesthesia Postprocedure Evaluation (Signed)
Anesthesia Post Note  Patient: Alison Morton  Procedure(s) Performed: POST PARTUM TUBAL LIGATION (Bilateral Abdomen)     Patient location during evaluation: PICU Anesthesia Type: Epidural Level of consciousness: awake and alert Pain management: pain level controlled Vital Signs Assessment: post-procedure vital signs reviewed and stable Respiratory status: spontaneous breathing, nonlabored ventilation and respiratory function stable Cardiovascular status: stable Postop Assessment: no headache, no backache and epidural receding Anesthetic complications: no    Last Vitals:  Vitals:   03/14/17 0610 03/14/17 0833  BP: 106/64 118/82  Pulse: 90 91  Resp: 16 (!) 9  Temp: 36.4 C 36.5 C  SpO2:  97%    Last Pain:  Vitals:   03/14/17 0915  TempSrc:   PainSc: 5    Pain Goal:                 Kingston Shawgo

## 2017-03-14 NOTE — Procedures (Signed)
Operative Note    Preoperative Diagnosis: Complete family status/requests permanent sterilization   Postoperative Diagnosis: Same   Procedure: Bilateral tubal ligation   Surgeon: Mindi SlickerBanga, C DO  Anesthesia: Epidural  Fluids: LR 100ml EBL: <2825ml UOP: 450ml   Findings: Grossly normal bilateral fallopian tubes   Specimen: portions of left and right fallopian tubes to pathology   Procedure Note  Pt to OR where epidural anesthesia redosed, tested and found to be adequate.  A foley catheter was placed in sterile fashion. Pt was prepped and draped  using normal sterile procedure. A small infraumbilical incision was made in a curvilinear fashion through the skin using a scalpel after injecting with 1% lidocaine. A hemostat was used to separate the underlying fat gently. The fascia was easily identified tented upward with clamps and incised, allowing access into the abdominal cavity. The bowel was packed with a sponge and uterine fundus exposed. The fallopian tube on the left was easily identified,  gently grabbed with a babcock and followed to its fimbriated end. The mid isthmus portion of the tube was exteriorized and a 2-3 cm tied. The intervening fallopian tube segment was then cut. Hemostasis noted. The right fallopian tube was similarly identified, followed to its fimbriated end and the mid isthmus portion tied and cut. Once again hemostasis noted. The peritoneum and fascia were then closed in a purse string fashion using vicryl suture.  Finally the skin incision was closed with 4-0 vicryl in a subcuticular fashion with excellent cosmesis noted. Steristrips were applied. Counts were noted to be correct. Pt tolerated procedure well.

## 2017-03-14 NOTE — Interval H&P Note (Signed)
History and Physical Interval Note: Pt  Is day 1 s/p svd with no complications. She requests bilateral tubal ligation for sterilization. Risks/benefits and alternatives have been reviewed and she consents to permanent sterilization Pt to OR when ready Consent verified   03/14/2017 7:08 AM  Alison Morton  has presented today for surgery, with the diagnosis of PP BTL  The various methods of treatment have been discussed with the patient and family. After consideration of risks, benefits and other options for treatment, the patient has consented to  Procedure(s): POST PARTUM TUBAL LIGATION (Bilateral) as a surgical intervention .  The patient's history has been reviewed, patient examined, no change in status, stable for surgery.  I have reviewed the patient's chart and labs.  Questions were answered to the patient's satisfaction.     [name]

## 2017-03-14 NOTE — Transfer of Care (Signed)
Immediate Anesthesia Transfer of Care Note  Patient: Alison Morton  Procedure(s) Performed: POST PARTUM TUBAL LIGATION (Bilateral Abdomen)  Patient Location: PACU  Anesthesia Type:Epidural  Level of Consciousness: sedated  Airway & Oxygen Therapy: Patient Spontanous Breathing  Post-op Assessment: Report given to RN  Post vital signs: Reviewed and stable  Last Vitals:  Vitals:   03/13/17 2045 03/14/17 0610  BP: 111/75 106/64  Pulse: 90 90  Resp: 16 16  Temp: 36.8 C 36.4 C  SpO2:      Last Pain:  Vitals:   03/14/17 0610  TempSrc: Oral  PainSc: 4          Complications: No apparent anesthesia complications

## 2017-03-14 NOTE — Anesthesia Postprocedure Evaluation (Signed)
Anesthesia Post Note  Patient: Alison Morton  Procedure(s) Performed: POST PARTUM TUBAL LIGATION (Bilateral Abdomen)     Patient location during evaluation: Mother Baby Anesthesia Type: Epidural Level of consciousness: awake and alert and oriented Pain management: pain level controlled Vital Signs Assessment: post-procedure vital signs reviewed and stable Respiratory status: spontaneous breathing Cardiovascular status: blood pressure returned to baseline Postop Assessment: no headache, no backache, epidural receding, patient able to bend at knees, no apparent nausea or vomiting and adequate PO intake Anesthetic complications: no    Last Vitals:  Vitals:   03/14/17 1040 03/14/17 1329  BP: 132/85 118/75  Pulse: 68 85  Resp: 18 18  Temp: 36.6 C 36.7 C  SpO2:  99%    Last Pain:  Vitals:   03/14/17 1420  TempSrc:   PainSc: 4    Pain Goal: Patients Stated Pain Goal: 2 (03/14/17 1135)               Angela Vazguez

## 2017-03-14 NOTE — Anesthesia Preprocedure Evaluation (Signed)
Anesthesia Evaluation  Patient identified by MRN, date of birth, ID band Patient awake    Reviewed: Allergy & Precautions, H&P , NPO status , Patient's Chart, lab work & pertinent test results, reviewed documented beta blocker date and time   Airway Mallampati: II  TM Distance: >3 FB Neck ROM: full    Dental no notable dental hx. (+) Teeth Intact   Pulmonary neg pulmonary ROS,    Pulmonary exam normal breath sounds clear to auscultation       Cardiovascular negative cardio ROS Normal cardiovascular exam Rhythm:regular Rate:Normal     Neuro/Psych negative neurological ROS  negative psych ROS   GI/Hepatic negative GI ROS, Neg liver ROS,   Endo/Other  negative endocrine ROS  Renal/GU negative Renal ROS  negative genitourinary   Musculoskeletal negative musculoskeletal ROS (+)   Abdominal   Peds negative pediatric ROS (+)  Hematology negative hematology ROS (+)   Anesthesia Other Findings       Reproductive/Obstetrics negative OB ROS (+) Pregnancy                             Anesthesia Physical  Anesthesia Plan  ASA: III  Anesthesia Plan: Epidural   Post-op Pain Management:    Induction:   PONV Risk Score and Plan: 2 and Treatment may vary due to age or medical condition, Ondansetron and Dexamethasone  Airway Management Planned:   Additional Equipment:   Intra-op Plan:   Post-operative Plan:   Informed Consent: I have reviewed the patients History and Physical, chart, labs and discussed the procedure including the risks, benefits and alternatives for the proposed anesthesia with the patient or authorized representative who has indicated his/her understanding and acceptance.   Dental Advisory Given  Plan Discussed with:   Anesthesia Plan Comments: (Labs checked- platelets confirmed with RN in room. Fetal heart tracing, per RN, reported to be stable enough for sitting  procedure. Discussed epidural, and patient consents to the procedure:  included risk of possible headache,backache, failed block, allergic reaction, and nerve injury. This patient was asked if she had any questions or concerns before the procedure started.)        Anesthesia Quick Evaluation

## 2017-03-15 ENCOUNTER — Other Ambulatory Visit: Payer: Self-pay

## 2017-03-15 MED ORDER — OXYCODONE HCL 5 MG PO TABS: 5.0000 mg | ORAL_TABLET | Freq: Four times a day (QID) | ORAL | 0 refills | Status: DC | PRN

## 2017-03-15 MED ORDER — PRENATAL 27-0.8 MG PO TABS: 1.0000 | ORAL_TABLET | Freq: Every day | ORAL | 3 refills | Status: DC

## 2017-03-15 MED ORDER — IBUPROFEN 600 MG PO TABS: 600.0000 mg | ORAL_TABLET | Freq: Four times a day (QID) | ORAL | 1 refills | Status: DC | PRN

## 2017-03-15 NOTE — Lactation Note (Signed)
This note was copied from a baby's chart. Lactation Consultation Note Mom is just formula feeding. No desire to BF. Engorgement management discussed. Patient Name: Alison Morton [id number]'s Date: 03/15/2017     Maternal Data    Feeding Feeding Type: Bottle Fed - Formula  LATCH Score                   Interventions    Lactation Tools Discussed/Used     Consult Status      [name] 03/15/2017, 5:08 AM

## 2017-03-15 NOTE — Progress Notes (Addendum)
Post Partum Day 2/POD#1 Subjective: no complaints, up ad lib, voiding, tolerating PO and pain controlled, nl lochia  Objective: Blood pressure (!) 124/56, pulse 80, temperature 98.2 F (36.8 C), temperature source Oral, resp. rate 14, height 5\' 3"  (1.6 m), weight 98.9 kg (218 lb), last menstrual period 06/09/2016, SpO2 99 %, unknown if currently breastfeeding.  Physical Exam:  General: alert and no distress Lochia: appropriate Uterine Fundus: firm Inc C/D/I  Recent Labs    03/13/17 0800 03/14/17 0512  HGB 12.0 10.2*  HCT 37.4 31.6*    Assessment/Plan: Discharge home, Breastfeeding and Lactation consult.  Routine care, d/c home with motrin, percocet and PNV.     LOS: 2 days   Hanzel Pizzo Bovard-Stuckert 03/15/2017, 8:36 AM

## 2017-03-15 NOTE — Discharge Summary (Signed)
OB Discharge Summary     Patient Name: Alison Morton DOB: 06-09-1990 MRN: [id number]  Date of admission: 03/13/2017 Delivering MD: Pryor OchoaBANGA, CECILIA [facility]   Date of discharge: 03/15/2017  Admitting diagnosis: INDUCTION Intrauterine pregnancy: 5256w4d     Secondary diagnosis:  Active Problems:   Postpartum care following vaginal delivery   Pregnancy   Normal spontaneous vaginal delivery  Additional problems: N/A     Discharge diagnosis: Term Pregnancy Delivered                                                                                                Post partum procedures:postpartum tubal ligation  Augmentation: AROM and Pitocin  Complications: None  Hospital course:  Onset of Labor With Vaginal Delivery     26 y.o. yo [id number] at 6256w4d was admitted in Latent Labor on 03/13/2017. Patient had an uncomplicated labor course as follows:  Membrane Rupture Time/Date: 9:59 AM ,03/13/2017   Intrapartum Procedures: Episiotomy: None [1]                                         Lacerations:  None [1]  Patient had a delivery of a Viable infant. 03/13/2017  Information for the patient's newborn:  Butler DenmarkCompton, Girl Bexlee [[id number]][[id number]]  Delivery Method: Vaginal, Spontaneous(Filed from Delivery Summary)    Pateint had an uncomplicated postpartum course.  She is ambulating, tolerating a regular diet, passing flatus, and urinating well. Patient is discharged home in stable condition on 03/15/17.   Physical exam  Vitals:   03/14/17 1329 03/14/17 1731 03/14/17 2257 03/15/17 0500  BP: 118/75 (!) 115/46 118/65 (!) 124/56  Pulse: 85 92 89 80  Resp: 18 18 16 14   Temp: 98 F (36.7 C) 98.8 F (37.1 C) 98.2 F (36.8 C)   TempSrc:  Oral Oral Oral  SpO2: 99%     Weight:      Height:       General: alert and no distress Lochia: appropriate Uterine Fundus: firm Incision: Healing well with no significant drainage DVT Evaluation: No evidence of DVT seen on physical exam. Labs: Lab  Results  Component Value Date   WBC 9.5 03/14/2017   HGB 10.2 (L) 03/14/2017   HCT 31.6 (L) 03/14/2017   MCV 93.5 03/14/2017   PLT 145 (L) 03/14/2017   No flowsheet data found.  Discharge instruction: per After Visit Summary and "Baby and Me Booklet".  After visit meds:  Allergies as of 03/15/2017   No Known Allergies     Medication List    TAKE these medications   acetaminophen 325 MG tablet Commonly known as:  TYLENOL Take 325 mg by mouth every 6 (six) hours as needed for mild pain.   ibuprofen 600 MG tablet Commonly known as:  ADVIL,MOTRIN Take 1 tablet (600 mg total) by mouth every 6 (six) hours as needed.   multivitamin-prenatal 27-0.8 MG Tabs tablet Take 1 tablet by mouth daily at 12 noon.   oxyCODONE 5 MG immediate release tablet Commonly known  as:  Oxy IR/ROXICODONE Take 1 tablet (5 mg total) by mouth every 6 (six) hours as needed for severe pain.   oxymetazoline 0.05 % nasal spray Commonly known as:  AFRIN Place 1 spray into both nostrils 2 (two) times daily.       Diet: routine diet  Activity: Advance as tolerated. Pelvic rest for 6 weeks.   Outpatient follow up:2 and 6 weeks Follow up Appt:No future appointments. Follow up Visit:No Follow-up on file.  Postpartum contraception: Tubal Ligation  Newborn Data: Live born female  Birth Weight: 8 lb 3.9 oz (3739 g) APGAR: 9, 9  Newborn Delivery   Time head delivered:  03/13/2017 13:44:00 Birth date/time:  03/13/2017 13:44:00 Delivery type:  Vaginal, Spontaneous     Baby Feeding: Breast Disposition:home with mother   03/15/2017 Sherian ReinJody Bovard-Stuckert, MD

## 2017-04-18 ENCOUNTER — Ambulatory Visit (INDEPENDENT_AMBULATORY_CARE_PROVIDER_SITE_OTHER): Payer: Managed Care, Other (non HMO)

## 2017-04-18 ENCOUNTER — Ambulatory Visit (INDEPENDENT_AMBULATORY_CARE_PROVIDER_SITE_OTHER): Payer: Managed Care, Other (non HMO) | Admitting: Orthopaedic Surgery

## 2017-04-18 ENCOUNTER — Encounter (INDEPENDENT_AMBULATORY_CARE_PROVIDER_SITE_OTHER): Payer: Self-pay | Admitting: Orthopaedic Surgery

## 2017-04-18 DIAGNOSIS — M25532 Pain in left wrist: Secondary | ICD-10-CM | POA: Diagnosis not present

## 2017-04-18 DIAGNOSIS — M67432 Ganglion, left wrist: Secondary | ICD-10-CM

## 2017-04-18 NOTE — Progress Notes (Signed)
Office Visit Note   Patient: Alison Morton           Date of Birth: Feb 08, 1991           MRN: [id number] Visit Date: 04/18/2017              Requested by: No referring provider defined for this encounter. PCP: Patient, No Pcp Per   Assessment & Plan: Visit Diagnoses:  1. Ganglion cyst of dorsum of left wrist     Plan: Impression is recurrent left dorsal wrist ganglion cyst.  We discussed nonoperative versus operative treatment and patient wishes to have removal of this recurrent cyst.  We discussed the risks of recurrence, infection, neurovascular injury, tendon injury.  She understands and wishes to proceed.  We will schedule her in the future.  Follow-Up Instructions: Return if symptoms worsen or fail to improve.   Orders:  Orders Placed This Encounter  Procedures  . XR Wrist Complete Left   No orders of the defined types were placed in this encounter.     Procedures: No procedures performed   Clinical Data: No additional findings.   Subjective: Chief Complaint  Patient presents with  . Left Wrist - Pain    Patient is a [age] female who comes in with a recurrent dorsal wrist ganglion cyst.  She previously had this removed in March 2018 by Dr. Mina MarbleWeingold.  She also had a concurrent carpal tunnel release which she did well with.  In the last month the cyst has recurred.  She is now interested in pursuing reexcision of the cyst.    Review of Systems  Constitutional: Negative.   HENT: Negative.   Eyes: Negative.   Respiratory: Negative.   Cardiovascular: Negative.   Endocrine: Negative.   Musculoskeletal: Negative.   Neurological: Negative.   Hematological: Negative.   Psychiatric/Behavioral: Negative.   All other systems reviewed and are negative.    Objective: Vital Signs: There were no vitals taken for this visit.  Physical Exam  Constitutional: She is oriented to person, place, and time. She appears well-developed and well-nourished.  HENT:    Head: Normocephalic and atraumatic.  Eyes: EOM are normal.  Neck: Neck supple.  Pulmonary/Chest: Effort normal.  Abdominal: Soft.  Neurological: She is alert and oriented to person, place, and time.  Skin: Skin is warm. Capillary refill takes less than 2 seconds.  Psychiatric: She has a normal mood and affect. Her behavior is normal. Judgment and thought content normal.  Nursing note and vitals reviewed.   Ortho Exam Left wrist exam shows a palpable firm semimobile mass of the dorsum of the wrist underlying the previous healed surgical scar.  There is no worrisome features or evidence of infection. Specialty Comments:  No specialty comments available.  Imaging: Xr Wrist Complete Left  Result Date: 04/18/2017 No acute or structural abnormalities    PMFS History: Patient Active Problem List   Diagnosis Date Noted  . Pregnancy 03/13/2017  . Normal spontaneous vaginal delivery 03/13/2017  . Bleeding in early pregnancy 08/05/2016  . Term pregnancy 11/01/2015  . SVD (spontaneous vaginal delivery) 11/01/2015  . Postpartum care following vaginal delivery 11/01/2015  . Abnormal fetal echocardiogram, affecting care of mother, antepartum 06/25/2015  . Fetal arrhythmia affecting pregnancy, antepartum 06/25/2015  . Encounter for Nexplanon removal 08/15/2012   Past Medical History:  Diagnosis Date  . Medical history non-contributory     Family History  Problem Relation Age of Onset  . Stroke Maternal Grandfather   .  Cancer Maternal Grandfather        breast  . Hypertension Father     Past Surgical History:  Procedure Laterality Date  . CARPAL TUNNEL RELEASE    . GANGLION CYST EXCISION    . MOUTH SURGERY    . TUBAL LIGATION Bilateral 03/14/2017   Procedure: POST PARTUM TUBAL LIGATION;  Surgeon: Edwinna Areola, DO;  Location: [facility];  Service: Gynecology;  Laterality: Bilateral;   Social History   Occupational History  . Not on file  Tobacco Use  .  Smoking status: Never Smoker  . Smokeless tobacco: Never Used  Substance and Sexual Activity  . Alcohol use: No  . Drug use: No  . Sexual activity: No    Birth control/protection: Injection

## 2017-04-20 ENCOUNTER — Telehealth (INDEPENDENT_AMBULATORY_CARE_PROVIDER_SITE_OTHER): Payer: Self-pay | Admitting: Orthopaedic Surgery

## 2017-04-20 NOTE — Telephone Encounter (Signed)
See message below °

## 2017-04-20 NOTE — Telephone Encounter (Signed)
I can take her out of work once she has surgery

## 2017-04-20 NOTE — Telephone Encounter (Signed)
Patient called wanting to know if Dr. Roda ShuttersXu would be willing to extend her short term disability to beyond her surgery because she is currently out on Maternity leave which ends January 27th.  There is a fax coming for Dr. Roda ShuttersXu to fill out and sign in order to extend it.  Please advise patient.  CB#[phone].  Thank you.

## 2017-04-21 NOTE — Telephone Encounter (Signed)
Called patient to advise on message.  

## 2017-04-28 DIAGNOSIS — M67432 Ganglion, left wrist: Secondary | ICD-10-CM

## 2017-04-28 HISTORY — DX: Ganglion, left wrist: M67.432

## 2017-05-08 ENCOUNTER — Other Ambulatory Visit: Payer: Self-pay

## 2017-05-08 ENCOUNTER — Encounter (HOSPITAL_BASED_OUTPATIENT_CLINIC_OR_DEPARTMENT_OTHER): Payer: Self-pay | Admitting: *Deleted

## 2017-05-10 ENCOUNTER — Encounter (HOSPITAL_BASED_OUTPATIENT_CLINIC_OR_DEPARTMENT_OTHER): Payer: Self-pay | Admitting: Anesthesiology

## 2017-05-10 ENCOUNTER — Ambulatory Visit (HOSPITAL_BASED_OUTPATIENT_CLINIC_OR_DEPARTMENT_OTHER): Payer: Medicaid Other | Admitting: Anesthesiology

## 2017-05-10 ENCOUNTER — Other Ambulatory Visit: Payer: Self-pay

## 2017-05-10 ENCOUNTER — Ambulatory Visit (HOSPITAL_BASED_OUTPATIENT_CLINIC_OR_DEPARTMENT_OTHER)
Admission: RE | Admit: 2017-05-10 | Discharge: 2017-05-10 | Disposition: A | Discharge status: Home / Self Care | Payer: Medicaid Other | Source: Ambulatory Visit | Attending: Orthopaedic Surgery | Admitting: Orthopaedic Surgery

## 2017-05-10 ENCOUNTER — Encounter (HOSPITAL_BASED_OUTPATIENT_CLINIC_OR_DEPARTMENT_OTHER)
Admission: RE | Disposition: A | Discharge status: Home / Self Care | Payer: Self-pay | Source: Ambulatory Visit | Attending: Orthopaedic Surgery

## 2017-05-10 DIAGNOSIS — M67432 Ganglion, left wrist: Secondary | ICD-10-CM | POA: Diagnosis not present

## 2017-05-10 DIAGNOSIS — M67439 Ganglion, unspecified wrist: Secondary | ICD-10-CM

## 2017-05-10 HISTORY — DX: Ganglion, left wrist: M67.432

## 2017-05-10 HISTORY — PX: GANGLION CYST EXCISION: SHX1691

## 2017-05-10 SURGERY — EXCISION, GANGLION CYST, WRIST
Anesthesia: General | Site: Wrist | Laterality: Left

## 2017-05-10 MED ORDER — FENTANYL CITRATE (PF) 100 MCG/2ML IJ SOLN
INTRAMUSCULAR | Status: AC
  Filled 2017-05-10: qty 2

## 2017-05-10 MED ORDER — LACTATED RINGERS IV SOLN: INTRAVENOUS | Status: DC

## 2017-05-10 MED ORDER — PROMETHAZINE HCL 25 MG PO TABS: 25.0000 mg | ORAL_TABLET | Freq: Four times a day (QID) | ORAL | 1 refills | Status: DC | PRN

## 2017-05-10 MED ORDER — BUPIVACAINE HCL (PF) 0.5 % IJ SOLN
INTRAMUSCULAR | Status: DC | PRN
  Administered 2017-05-10: 8 mL

## 2017-05-10 MED ORDER — OXYCODONE HCL 5 MG PO TABS
5.0000 mg | ORAL_TABLET | Freq: Once | ORAL | Status: AC
  Administered 2017-05-10: 5 mg via ORAL

## 2017-05-10 MED ORDER — OXYCODONE HCL 5 MG PO TABS
ORAL_TABLET | ORAL | Status: AC
  Filled 2017-05-10: qty 1

## 2017-05-10 MED ORDER — DEXAMETHASONE SODIUM PHOSPHATE 10 MG/ML IJ SOLN
INTRAMUSCULAR | Status: AC
  Filled 2017-05-10: qty 1

## 2017-05-10 MED ORDER — EPHEDRINE 5 MG/ML INJ
INTRAVENOUS | Status: AC
  Filled 2017-05-10: qty 10

## 2017-05-10 MED ORDER — MIDAZOLAM HCL 2 MG/2ML IJ SOLN
INTRAMUSCULAR | Status: AC
  Filled 2017-05-10: qty 2

## 2017-05-10 MED ORDER — CHLORHEXIDINE GLUCONATE 4 % EX LIQD: 60.0000 mL | Freq: Once | CUTANEOUS | Status: DC

## 2017-05-10 MED ORDER — PROPOFOL 10 MG/ML IV BOLUS
INTRAVENOUS | Status: DC | PRN
  Administered 2017-05-10: 200 mg via INTRAVENOUS

## 2017-05-10 MED ORDER — CEFAZOLIN SODIUM-DEXTROSE 2-4 GM/100ML-% IV SOLN
2.0000 g | INTRAVENOUS | Status: AC
  Administered 2017-05-10: 2 g via INTRAVENOUS

## 2017-05-10 MED ORDER — SCOPOLAMINE 1 MG/3DAYS TD PT72: 1.0000 | MEDICATED_PATCH | Freq: Once | TRANSDERMAL | Status: DC | PRN

## 2017-05-10 MED ORDER — 0.9 % SODIUM CHLORIDE (POUR BTL) OPTIME
TOPICAL | Status: DC | PRN
  Administered 2017-05-10: 200 mL

## 2017-05-10 MED ORDER — CEFAZOLIN SODIUM-DEXTROSE 2-4 GM/100ML-% IV SOLN
INTRAVENOUS | Status: AC
  Filled 2017-05-10: qty 100

## 2017-05-10 MED ORDER — ONDANSETRON HCL 4 MG/2ML IJ SOLN
INTRAMUSCULAR | Status: AC
  Filled 2017-05-10: qty 2

## 2017-05-10 MED ORDER — LACTATED RINGERS IV SOLN
INTRAVENOUS | Status: DC
  Administered 2017-05-10 (×2): via INTRAVENOUS

## 2017-05-10 MED ORDER — MEPERIDINE HCL 25 MG/ML IJ SOLN: 6.2500 mg | INTRAMUSCULAR | Status: DC | PRN

## 2017-05-10 MED ORDER — LIDOCAINE 2% (20 MG/ML) 5 ML SYRINGE
INTRAMUSCULAR | Status: AC
  Filled 2017-05-10: qty 5

## 2017-05-10 MED ORDER — METOCLOPRAMIDE HCL 5 MG/ML IJ SOLN: 10.0000 mg | Freq: Once | INTRAMUSCULAR | Status: DC | PRN

## 2017-05-10 MED ORDER — SUCCINYLCHOLINE CHLORIDE 200 MG/10ML IV SOSY
PREFILLED_SYRINGE | INTRAVENOUS | Status: AC
  Filled 2017-05-10: qty 10

## 2017-05-10 MED ORDER — LIDOCAINE HCL (CARDIAC) 20 MG/ML IV SOLN
INTRAVENOUS | Status: DC | PRN
  Administered 2017-05-10: 50 mg via INTRAVENOUS
  Administered 2017-05-10: 100 mg via INTRAVENOUS

## 2017-05-10 MED ORDER — FENTANYL CITRATE (PF) 100 MCG/2ML IJ SOLN
50.0000 ug | INTRAMUSCULAR | Status: AC | PRN
  Administered 2017-05-10 (×4): 50 ug via INTRAVENOUS

## 2017-05-10 MED ORDER — ONDANSETRON HCL 4 MG/2ML IJ SOLN
INTRAMUSCULAR | Status: DC | PRN
  Administered 2017-05-10: 4 mg via INTRAVENOUS

## 2017-05-10 MED ORDER — DEXAMETHASONE SODIUM PHOSPHATE 4 MG/ML IJ SOLN
INTRAMUSCULAR | Status: DC | PRN
  Administered 2017-05-10: 10 mg via INTRAVENOUS

## 2017-05-10 MED ORDER — PHENYLEPHRINE 40 MCG/ML (10ML) SYRINGE FOR IV PUSH (FOR BLOOD PRESSURE SUPPORT)
PREFILLED_SYRINGE | INTRAVENOUS | Status: AC
  Filled 2017-05-10: qty 10

## 2017-05-10 MED ORDER — HYDROCODONE-ACETAMINOPHEN 7.5-325 MG PO TABS: 1.0000 | ORAL_TABLET | Freq: Four times a day (QID) | ORAL | 0 refills | Status: DC | PRN

## 2017-05-10 MED ORDER — FENTANYL CITRATE (PF) 100 MCG/2ML IJ SOLN
25.0000 ug | INTRAMUSCULAR | Status: DC | PRN
  Administered 2017-05-10 (×2): 50 ug via INTRAVENOUS

## 2017-05-10 MED ORDER — MIDAZOLAM HCL 2 MG/2ML IJ SOLN
1.0000 mg | INTRAMUSCULAR | Status: DC | PRN
  Administered 2017-05-10: 2 mg via INTRAVENOUS

## 2017-05-10 SURGICAL SUPPLY — 58 items
ADH SKN CLS APL DERMABOND .7 (GAUZE/BANDAGES/DRESSINGS)
BLADE HEX COATED 2.75 (ELECTRODE) IMPLANT
BLADE SURG 15 STRL LF DISP TIS (BLADE) ×2 IMPLANT
BLADE SURG 15 STRL SS (BLADE) ×3
BNDG CMPR 9X4 STRL LF SNTH (GAUZE/BANDAGES/DRESSINGS) ×1
BNDG COHESIVE 4X5 TAN STRL (GAUZE/BANDAGES/DRESSINGS) ×2 IMPLANT
BNDG ESMARK 4X9 LF (GAUZE/BANDAGES/DRESSINGS) ×3 IMPLANT
BRUSH SCRUB EZ PLAIN DRY (MISCELLANEOUS) ×3 IMPLANT
CANISTER SUCT 1200ML W/VALVE (MISCELLANEOUS) IMPLANT
CORD BIPOLAR FORCEPS 12FT (ELECTRODE) ×2 IMPLANT
COVER BACK TABLE 60X90IN (DRAPES) ×3 IMPLANT
CUFF TOURNIQUET SINGLE 18IN (TOURNIQUET CUFF) ×3 IMPLANT
DECANTER SPIKE VIAL GLASS SM (MISCELLANEOUS) IMPLANT
DERMABOND ADVANCED (GAUZE/BANDAGES/DRESSINGS)
DERMABOND ADVANCED .7 DNX12 (GAUZE/BANDAGES/DRESSINGS) IMPLANT
DRAPE EXTREMITY T 121X128X90 (DRAPE) ×3 IMPLANT
DRAPE IMP U-DRAPE 54X76 (DRAPES) ×3 IMPLANT
DRAPE SURG 17X23 STRL (DRAPES) ×4 IMPLANT
DRSG EMULSION OIL 3X3 NADH (GAUZE/BANDAGES/DRESSINGS) ×1 IMPLANT
ELECT REM PT RETURN 9FT ADLT (ELECTROSURGICAL)
ELECTRODE REM PT RTRN 9FT ADLT (ELECTROSURGICAL) ×1 IMPLANT
GAUZE SPONGE 4X4 12PLY STRL (GAUZE/BANDAGES/DRESSINGS) ×3 IMPLANT
GAUZE SPONGE 4X4 16PLY XRAY LF (GAUZE/BANDAGES/DRESSINGS) IMPLANT
GAUZE XEROFORM 1X8 LF (GAUZE/BANDAGES/DRESSINGS) ×3 IMPLANT
GLOVE BIOGEL PI IND STRL 7.0 (GLOVE) IMPLANT
GLOVE BIOGEL PI INDICATOR 7.0 (GLOVE) ×4
GLOVE ECLIPSE 6.5 STRL STRAW (GLOVE) ×2 IMPLANT
GLOVE SKINSENSE NS SZ7.5 (GLOVE) ×2
GLOVE SKINSENSE STRL SZ7.5 (GLOVE) ×1 IMPLANT
GLOVE SURG SYN 7.5  E (GLOVE) ×2
GLOVE SURG SYN 7.5 E (GLOVE) ×1 IMPLANT
GLOVE SURG SYN 7.5 PF PI (GLOVE) ×1 IMPLANT
GOWN STRL REIN XL XLG (GOWN DISPOSABLE) ×3 IMPLANT
GOWN STRL REUS W/ TWL LRG LVL3 (GOWN DISPOSABLE) ×1 IMPLANT
GOWN STRL REUS W/TWL LRG LVL3 (GOWN DISPOSABLE) ×6
NS IRRIG 1000ML POUR BTL (IV SOLUTION) IMPLANT
PACK BASIN DAY SURGERY FS (CUSTOM PROCEDURE TRAY) ×3 IMPLANT
PAD CAST 4YDX4 CTTN HI CHSV (CAST SUPPLIES) IMPLANT
PADDING CAST COTTON 4X4 STRL (CAST SUPPLIES)
PADDING CAST SYNTHETIC 4 (CAST SUPPLIES)
PADDING CAST SYNTHETIC 4X4 STR (CAST SUPPLIES) IMPLANT
PENCIL BUTTON HOLSTER BLD 10FT (ELECTRODE) IMPLANT
SPLINT FIBERGLASS 3X35 (CAST SUPPLIES) ×2 IMPLANT
SPONGE LAP 18X18 X RAY DECT (DISPOSABLE) IMPLANT
STOCKINETTE 4X48 STRL (DRAPES) ×2 IMPLANT
SUCTION FRAZIER HANDLE 10FR (MISCELLANEOUS)
SUCTION TUBE FRAZIER 10FR DISP (MISCELLANEOUS) ×1 IMPLANT
SUT ETHILON 2 0 FS 18 (SUTURE) IMPLANT
SUT ETHILON 4 0 PS 2 18 (SUTURE) ×2 IMPLANT
SUT VIC AB 0 CT1 27 (SUTURE) ×3
SUT VIC AB 0 CT1 27XBRD ANBCTR (SUTURE) ×1 IMPLANT
SUT VIC AB 2-0 CT1 27 (SUTURE) ×3
SUT VIC AB 2-0 CT1 TAPERPNT 27 (SUTURE) IMPLANT
SYR BULB 3OZ (MISCELLANEOUS) ×2 IMPLANT
TOWEL OR 17X24 6PK STRL BLUE (TOWEL DISPOSABLE) ×3 IMPLANT
TOWEL OR NON WOVEN STRL DISP B (DISPOSABLE) ×3 IMPLANT
TRAY DSU PREP LF (CUSTOM PROCEDURE TRAY) ×2 IMPLANT
YANKAUER SUCT BULB TIP NO VENT (SUCTIONS) IMPLANT

## 2017-05-10 NOTE — Anesthesia Postprocedure Evaluation (Signed)
Anesthesia Post Note  Patient: Alison Morton  Procedure(s) Performed: REMOVAL GANGLION CYST LEFT WRIST (Left Wrist)     Patient location during evaluation: PACU Anesthesia Type: General Level of consciousness: awake and alert Pain management: pain level controlled Vital Signs Assessment: post-procedure vital signs reviewed and stable Respiratory status: spontaneous breathing, nonlabored ventilation, respiratory function stable and patient connected to nasal cannula oxygen Cardiovascular status: blood pressure returned to baseline and stable Postop Assessment: no apparent nausea or vomiting Anesthetic complications: no    Last Vitals:  Vitals:   05/10/17 1215 05/10/17 1231  BP: 118/67 (!) 150/96  Pulse: 97 91  Resp: (!) 21 11  Temp:    SpO2: 100% 100%    Last Pain:  Vitals:   05/10/17 1231  TempSrc:   PainSc: 5                  Phillips Groutarignan, Korinna Tat

## 2017-05-10 NOTE — Discharge Instructions (Signed)
Post Anesthesia Home Care Instructions  Activity: Get plenty of rest for the remainder of the day. A responsible individual must stay with you for 24 hours following the procedure.  For the next 24 hours, DO NOT: -Drive a car -Advertising copywriterperate machinery -Drink alcoholic beverages -Take any medication unless instructed by your physician -Make any legal decisions or sign important papers.  Meals: Start with liquid foods such as gelatin or soup. Progress to regular foods as tolerated. Avoid greasy, spicy, heavy foods. If nausea and/or vomiting occur, drink only clear liquids until the nausea and/or vomiting subsides. Call your physician if vomiting continues.  Special Instructions/Symptoms: Your throat may feel dry or sore from the anesthesia or the breathing tube placed in your throat during surgery. If this causes discomfort, gargle with warm salt water. The discomfort should disappear within 24 hours.  If you had a scopolamine patch placed behind your ear for the management of post- operative nausea and/or vomiting:  1. The medication in the patch is effective for 72 hours, after which it should be removed.  Wrap patch in a tissue and discard in the trash. Wash hands thoroughly with soap and water. 2. You may remove the patch earlier than 72 hours if you experience unpleasant side effects which may include dry mouth, dizziness or visual disturbances. 3. Avoid touching the patch. Wash your hands with soap and water after contact with the patch.   May take next pain pill if needed at 7p tonight   Postoperative instructions:  Weightbearing instructions: no heavy lifting  Dressing instructions: Keep your dressing and/or splint clean and dry at all times.  It will be removed at your first post-operative appointment.  Your stitches and/or staples will be removed at this visit.  Incision instructions:  Do not soak your incision for 3 weeks after surgery.  If the incision gets wet, pat dry and do  not scrub the incision.  Pain control:  You have been given a prescription to be taken as directed for post-operative pain control.  In addition, elevate the operative extremity above the heart at all times to prevent swelling and throbbing pain.  Take over-the-counter Colace, 100mg  by mouth twice a day while taking narcotic pain medications to help prevent constipation.  Follow up appointments: 1) 10-14 days for suture removal and wound check. 2) Dr. Roda ShuttersXu as scheduled. 3. Remain out of work until follow up appointment.   -------------------------------------------------------------------------------------------------------------  After Surgery Pain Control:  After your surgery, post-surgical discomfort or pain is likely. This discomfort can last several days to a few weeks. At certain times of the day your discomfort may be more intense.  Did you receive a nerve block?  A nerve block can provide pain relief for one hour to two days after your surgery. As long as the nerve block is working, you will experience little or no sensation in the area the surgeon operated on.  As the nerve block wears off, you will begin to experience pain or discomfort. It is very important that you begin taking your prescribed pain medication before the nerve block fully wears off. Treating your pain at the first sign of the block wearing off will ensure your pain is better controlled and more tolerable when full-sensation returns. Do not wait until the pain is intolerable, as the medicine will be less effective. It is better to treat pain in advance than to try and catch up.  General Anesthesia:  If you did not receive a nerve block during your  surgery, you will need to start taking your pain medication shortly after your surgery and should continue to do so as prescribed by your surgeon.  Pain Medication:  Most commonly we prescribe Vicodin and Percocet for post-operative pain. Both of these medications contain a  combination of acetaminophen (Tylenol) and a narcotic to help control pain.   It takes between 30 and 45 minutes before pain medication starts to work. It is important to take your medication before your pain level gets too intense.   Nausea is a common side effect of many pain medications. You will want to eat something before taking your pain medicine to help prevent nausea.   If you are taking a prescription pain medication that contains acetaminophen, we recommend that you do not take additional over the counter acetaminophen (Tylenol).  Other pain relieving options:   Using a cold pack to ice the affected area a few times a day (15 to 20 minutes at a time) can help to relieve pain, reduce swelling and bruising.   Elevation of the affected area can also help to reduce pain and swelling.

## 2017-05-10 NOTE — Op Note (Signed)
   Date of Surgery: 05/10/2017  INDICATIONS: Ms. Alison Morton is a 27 y.o.-year-old female with a recurrent left dorsal wrist ganglion cyst;  The patient did consent to the procedure after discussion of the risks and benefits.  PREOPERATIVE DIAGNOSIS: Recurrent left dorsal wrist ganglion cyst  POSTOPERATIVE DIAGNOSIS: Same.  PROCEDURE:  1. Excision of left dorsal wrist ganglion cyst 2. Tenolysis of fourth dorsal wrist compartment extensor tendons  SURGEON: N. Glee ArvinMichael Karolyne Timmons, M.D.  ASSIST: Starlyn SkeansMary Lindsey WasillaStanbery, [place]; necessary for the timely completion of procedure and due to complexity of procedure.  ANESTHESIA:  general  IV FLUIDS AND URINE: See anesthesia.  ESTIMATED BLOOD LOSS: Minimal mL.  IMPLANTS: None  DRAINS: None  COMPLICATIONS: None.  DESCRIPTION OF PROCEDURE: The patient was brought to the operating room and placed supine on the operating table.  The patient had been signed prior to the procedure and this was documented. The patient had the anesthesia placed by the anesthesiologist.  A time-out was performed to confirm that this was the correct patient, site, side and location. The patient did receive antibiotics prior to the incision and was re-dosed during the procedure as needed at indicated intervals.  A tourniquet was placed.  The patient had the operative extremity prepped and draped in the standard surgical fashion.    An L-shaped incision based over the previous surgical scar was used.  Dissection was carried through the subcutaneous tissue onto the extensor tendons.  We then found the ganglion cyst with palpation and we continued our dissection circumferentially around the cyst.  Tenolysis of the fourth dorsal wrist compartment extensor tendons was performed the cyst was then further traced down to the.  Dorsal wrist capsule.  There was extensive scar tissue from the previous procedure.  I then remove the entire cyst as a whole.  I then closed the capsular rent with  interrupted 0 Vicryl.  Hemostasis was then obtained.  The cyst was then sent off to pathology.  The wound was closed in a layered fashion using 2-0 Vicryl and 4-0 nylon.  Sterile dressings were applied.  The wrist was placed in a volar splint.  Patient tolerated procedure well had no immediate complications.  POSTOPERATIVE PLAN: Patient will follow-up in the office in 10-14 days for suture removal.  She is to remain out of work until follow-up.  Alison ReelN. Michael Mariene Dickerman, MD [facility] [phone] 11:42 AM

## 2017-05-10 NOTE — Transfer of Care (Signed)
Immediate Anesthesia Transfer of Care Note  Patient: Alison Morton  Procedure(s) Performed: REMOVAL GANGLION CYST LEFT WRIST (Left Wrist)  Patient Location: PACU  Anesthesia Type:General  Level of Consciousness: awake, oriented and drowsy  Airway & Oxygen Therapy: Patient Spontanous Breathing and Patient connected to face mask oxygen  Post-op Assessment: Report given to RN and Post -op Vital signs reviewed and stable  Post vital signs: Reviewed and stable  Last Vitals:  Vitals:   05/10/17 0957 05/10/17 1204  BP:  133/89  Pulse: 80 (!) 103  Resp: 16 (!) 27  Temp: 36.7 C (P) 36.6 C  SpO2: 99% 100%    Last Pain:  Vitals:   05/10/17 0957  TempSrc: Oral  PainSc: 6          Complications: No apparent anesthesia complications

## 2017-05-10 NOTE — H&P (Signed)
    PREOPERATIVE H&P  Chief Complaint: Left wrist ganglion cyst  HPI: Alison Morton is a 27 y.o. female who presents for surgical treatment of Left wrist ganglion cyst.  She denies any changes in medical history.  Past Medical History:  Diagnosis Date  . Ganglion cyst of dorsum of left wrist 04/2017   Past Surgical History:  Procedure Laterality Date  . CARPAL TUNNEL RELEASE Left   . GANGLION CYST EXCISION Left    wrist  . TUBAL LIGATION Bilateral 03/14/2017   Procedure: POST PARTUM TUBAL LIGATION;  Surgeon: Edwinna AreolaBanga, Cecilia Worema, DO;  Location: [facility];  Service: Gynecology;  Laterality: Bilateral;  . WISDOM TOOTH EXTRACTION     Social History   Socioeconomic History  . Marital status: Single    Spouse name: None  . Number of children: None  . Years of education: None  . Highest education level: None  Social Needs  . Financial resource strain: None  . Food insecurity - worry: None  . Food insecurity - inability: None  . Transportation needs - medical: None  . Transportation needs - non-medical: None  Occupational History  . None  Tobacco Use  . Smoking status: Never Smoker  . Smokeless tobacco: Never Used  Substance and Sexual Activity  . Alcohol use: No  . Drug use: No  . Sexual activity: None  Other Topics Concern  . None  Social History Narrative  . None   Family History  Problem Relation Age of Onset  . Stroke Maternal Grandfather   . Cancer Maternal Grandfather        breast  . Hypertension Father    No Known Allergies Prior to Admission medications   Medication Sig Start Date End Date Taking? Authorizing Provider  metroNIDAZOLE (FLAGYL) 250 MG tablet Take 250 mg by mouth 2 (two) times daily.   Yes [provider]     Positive ROS: All other systems have been reviewed and were otherwise negative with the exception of those mentioned in the HPI and as above.  Physical Exam: General: Alert, no acute  distress Cardiovascular: No pedal edema Respiratory: No cyanosis, no use of accessory musculature GI: abdomen soft Skin: No lesions in the area of chief complaint Neurologic: Sensation intact distally Psychiatric: Patient is competent for consent with normal mood and affect Lymphatic: no lymphedema  MUSCULOSKELETAL: exam stable  Assessment: Left wrist ganglion cyst  Plan: Plan for Procedure(s): REMOVAL GANGLION CYST LEFT WRIST  The risks benefits and alternatives were discussed with the patient including but not limited to the risks of nonoperative treatment, versus surgical intervention including infection, bleeding, nerve injury,  blood clots, cardiopulmonary complications, morbidity, mortality, among others, and they were willing to proceed.   [name], MD   05/10/2017 8:29 AM

## 2017-05-10 NOTE — Anesthesia Procedure Notes (Signed)
Procedure Name: LMA Insertion Date/Time: 05/10/2017 11:17 AM Performed by: Ronnette HilaPayne, Coby Shrewsberry D, CRNA Pre-anesthesia Checklist: Patient identified, Emergency Drugs available, Suction available and Patient being monitored Patient Re-evaluated:Patient Re-evaluated prior to induction Oxygen Delivery Method: Circle system utilized Preoxygenation: Pre-oxygenation with 100% oxygen Induction Type: IV induction Ventilation: Mask ventilation without difficulty LMA: LMA inserted LMA Size: 4.0 Number of attempts: 1 Airway Equipment and Method: Bite block Placement Confirmation: positive ETCO2 Tube secured with: Tape Dental Injury: Teeth and Oropharynx as per pre-operative assessment

## 2017-05-10 NOTE — Anesthesia Preprocedure Evaluation (Signed)
Anesthesia Evaluation  Patient identified by MRN, date of birth, ID band Patient awake    Reviewed: Allergy & Precautions, NPO status , Patient's Chart, lab work & pertinent test results  Airway Mallampati: II  TM Distance: >3 FB Neck ROM: Full    Dental no notable dental hx.    Pulmonary neg pulmonary ROS,    Pulmonary exam normal breath sounds clear to auscultation       Cardiovascular negative cardio ROS Normal cardiovascular exam Rhythm:Regular Rate:Normal     Neuro/Psych negative neurological ROS  negative psych ROS   GI/Hepatic negative GI ROS, Neg liver ROS,   Endo/Other  negative endocrine ROS  Renal/GU negative Renal ROS  negative genitourinary   Musculoskeletal negative musculoskeletal ROS (+)   Abdominal   Peds negative pediatric ROS (+)  Hematology negative hematology ROS (+)   Anesthesia Other Findings   Reproductive/Obstetrics negative OB ROS                             Anesthesia Physical Anesthesia Plan  ASA: I  Anesthesia Plan: General   Post-op Pain Management:    Induction: Intravenous  PONV Risk Score and Plan: 3 and Ondansetron and Treatment may vary due to age or medical condition  Airway Management Planned: LMA  Additional Equipment:   Intra-op Plan:   Post-operative Plan: Extubation in OR  Informed Consent: I have reviewed the patients History and Physical, chart, labs and discussed the procedure including the risks, benefits and alternatives for the proposed anesthesia with the patient or authorized representative who has indicated his/her understanding and acceptance.   Dental advisory given  Plan Discussed with: CRNA  Anesthesia Plan Comments:         Anesthesia Quick Evaluation  

## 2017-05-11 ENCOUNTER — Encounter (HOSPITAL_BASED_OUTPATIENT_CLINIC_OR_DEPARTMENT_OTHER): Payer: Self-pay | Admitting: Orthopaedic Surgery

## 2017-05-12 ENCOUNTER — Telehealth (INDEPENDENT_AMBULATORY_CARE_PROVIDER_SITE_OTHER): Payer: Self-pay | Admitting: Orthopaedic Surgery

## 2017-05-12 NOTE — Telephone Encounter (Signed)
Patient called stating that the medication Dr. Roda ShuttersXu prescribed for her for the pain is not really working and she is not having much relief.  She is wanting to know if her prescription can be changed to something else.  CB#[phone].  Thank you.

## 2017-05-12 NOTE — Telephone Encounter (Signed)
FYI  IC patient and advised we do not have a provider in the office to address this request.  She spoke to doctor yesterday and was advised to take two of the hydrocodone, and supplement with ibuprofen 600 mg in between.  She is doing this and not getting much pain relief at this point.  I have discussed elevation and ice with her and she will call on call doc this weekend if still having problems.

## 2017-05-15 NOTE — Telephone Encounter (Signed)
Leave bandage on until fu appt.  Any specific questions?

## 2017-05-15 NOTE — Telephone Encounter (Signed)
Please advise 

## 2017-05-15 NOTE — Telephone Encounter (Signed)
Try taking an antiinflammatory with it first.  Would rather not give stronger meds, especially since returning to work.  Ok to go back to work, but I would have her lifting no more than two pounds.

## 2017-05-15 NOTE — Telephone Encounter (Signed)
Called patient to advise on message below. She states she would like to return to work on Wednesday. 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 Advised her to supplement it with something over the counter.

## 2017-05-15 NOTE — Telephone Encounter (Signed)
Please call pt to discuss pt care  °

## 2017-05-16 ENCOUNTER — Encounter (INDEPENDENT_AMBULATORY_CARE_PROVIDER_SITE_OTHER): Payer: Self-pay

## 2017-05-16 NOTE — Telephone Encounter (Signed)
Patient called back and would like to be out of work until next f/u visit instead.  note made. Patient aware.  emailed her the letter copy

## 2017-05-16 NOTE — Telephone Encounter (Signed)
Note made. Patient aware. Emailed her a copy

## 2017-05-16 NOTE — Telephone Encounter (Signed)
Tried to call patient no answer LMOM to return my call. 

## 2017-05-19 ENCOUNTER — Ambulatory Visit (INDEPENDENT_AMBULATORY_CARE_PROVIDER_SITE_OTHER): Payer: Managed Care, Other (non HMO) | Admitting: Orthopaedic Surgery

## 2017-05-23 ENCOUNTER — Encounter (INDEPENDENT_AMBULATORY_CARE_PROVIDER_SITE_OTHER): Payer: Self-pay | Admitting: Orthopaedic Surgery

## 2017-05-23 ENCOUNTER — Ambulatory Visit (INDEPENDENT_AMBULATORY_CARE_PROVIDER_SITE_OTHER): Payer: Self-pay | Admitting: Orthopaedic Surgery

## 2017-05-23 DIAGNOSIS — Z9889 Other specified postprocedural states: Secondary | ICD-10-CM

## 2017-05-23 MED ORDER — HYDROCODONE-ACETAMINOPHEN 5-325 MG PO TABS: 1.0000 | ORAL_TABLET | Freq: Two times a day (BID) | ORAL | 0 refills | Status: DC | PRN

## 2017-05-23 NOTE — Progress Notes (Signed)
   Post-Op Visit Note   Patient: Alison Morton           Date of Birth: January 22, 1991           MRN: [id number] Visit Date: 05/23/2017 PCP: Edwinna AreolaBanga, Cecilia Worema, DO   Assessment & Plan:  Chief Complaint:  Chief Complaint  Patient presents with  . Left Wrist - Pain   Visit Diagnoses:  1. H/O excision of ganglion cyst     Plan: Ms. Charlott HollerCompton comes in for follow-up.  13 days status post excision dorsal ganglion left wrist, date of surgery 05/10/2017.  She has been doing well since surgery.  No fevers chills or any other systemic symptoms.  Examination of her left wrist reveals a well-healing surgical incision with nylon sutures in place.  No evidence of infection or cellulitis.  She is rest intact distally.  At this point, we will place Ms. Kirt in a removable wrist splint.  She has range of motion exercises from her previous surgical intervention so she will start those.  We will extend her work note for another 4 weeks with restrictions of no lifting greater than 2 pounds.  She will follow-up with us on an as-needed basis.  She will call with concerns or questions in the meantime.  Follow-Up Instructions: Return if symptoms worsen or fail to improve.   Orders:  No orders of the defined types were placed in this encounter.  Meds ordered this encounter  Medications  . HYDROcodone-acetaminophen (NORCO) 5-325 MG tablet    Sig: Take 1-2 tablets by mouth 2 (two) times daily as needed for moderate pain.    Dispense:  28 tablet    Refill:  0    Imaging: No results found.  PMFS History: Patient Active Problem List   Diagnosis Date Noted  . H/O excision of ganglion cyst 05/23/2017  . Dorsal wrist ganglion 05/10/2017  . Pregnancy 03/13/2017  . Normal spontaneous vaginal delivery 03/13/2017  . Bleeding in early pregnancy 08/05/2016  . Term pregnancy 11/01/2015  . SVD (spontaneous vaginal delivery) 11/01/2015  . Postpartum care following vaginal delivery 11/01/2015  . Abnormal  fetal echocardiogram, affecting care of mother, antepartum 06/25/2015  . Fetal arrhythmia affecting pregnancy, antepartum 06/25/2015  . Encounter for Nexplanon removal 08/15/2012   Past Medical History:  Diagnosis Date  . Ganglion cyst of dorsum of left wrist 04/2017    Family History  Problem Relation Age of Onset  . Stroke Maternal Grandfather   . Cancer Maternal Grandfather        breast  . Hypertension Father     Past Surgical History:  Procedure Laterality Date  . CARPAL TUNNEL RELEASE Left   . GANGLION CYST EXCISION Left    wrist  . GANGLION CYST EXCISION Left 05/10/2017   Procedure: REMOVAL GANGLION CYST LEFT WRIST;  Surgeon: Tarry KosXu, Naiping M, MD;  Location:  SUR[facility]ERY [facility];  Service: Orthopedics;  Laterality: Left;  . TUBAL LIGATION Bilateral 03/14/2017   Procedure: POST PARTUM TUBAL LIGATION;  Surgeon: Edwinna AreolaBanga, Cecilia Worema, DO;  Location: [facility];  Service: Gynecology;  Laterality: Bilateral;  . WISDOM TOOTH EXTRACTION     Social History   Occupational History  . Not on file  Tobacco Use  . Smoking status: Never Smoker  . Smokeless tobacco: Never Used  Substance and Sexual Activity  . Alcohol use: No  . Drug use: No  . Sexual activity: Not on file

## 2018-06-01 ENCOUNTER — Other Ambulatory Visit: Payer: Self-pay | Admitting: Orthopedic Surgery

## 2018-06-04 ENCOUNTER — Ambulatory Visit (HOSPITAL_COMMUNITY): Admission: RE | Admit: 2018-06-04 | Payer: 59 | Source: Home / Self Care | Admitting: Orthopedic Surgery

## 2018-06-04 ENCOUNTER — Encounter (HOSPITAL_COMMUNITY): Admission: RE | Payer: Self-pay | Source: Home / Self Care

## 2018-06-04 SURGERY — EXCISION, GANGLION CYST, WRIST
Anesthesia: General | Laterality: Left

## 2018-06-08 ENCOUNTER — Other Ambulatory Visit: Payer: Self-pay

## 2020-06-24 ENCOUNTER — Other Ambulatory Visit: Payer: Self-pay

## 2020-06-24 ENCOUNTER — Encounter (HOSPITAL_BASED_OUTPATIENT_CLINIC_OR_DEPARTMENT_OTHER): Payer: Self-pay | Admitting: Orthopedic Surgery

## 2020-06-24 ENCOUNTER — Other Ambulatory Visit (HOSPITAL_COMMUNITY): Payer: Self-pay | Admitting: Orthopedic Surgery

## 2020-06-29 ENCOUNTER — Inpatient Hospital Stay (HOSPITAL_COMMUNITY): Admission: RE | Admit: 2020-06-29 | Payer: Self-pay | Source: Ambulatory Visit

## 2020-06-30 ENCOUNTER — Other Ambulatory Visit (HOSPITAL_COMMUNITY)
Admission: RE | Admit: 2020-06-30 | Discharge: 2020-06-30 | Disposition: A | Discharge status: Home / Self Care | Payer: BC Managed Care – PPO | Source: Ambulatory Visit | Attending: Orthopedic Surgery | Admitting: Orthopedic Surgery

## 2020-06-30 DIAGNOSIS — Z20822 Contact with and (suspected) exposure to covid-19: Secondary | ICD-10-CM | POA: Insufficient documentation

## 2020-06-30 DIAGNOSIS — Z91048 Other nonmedicinal substance allergy status: Secondary | ICD-10-CM | POA: Diagnosis not present

## 2020-06-30 DIAGNOSIS — Z01812 Encounter for preprocedural laboratory examination: Secondary | ICD-10-CM | POA: Insufficient documentation

## 2020-06-30 DIAGNOSIS — M21612 Bunion of left foot: Secondary | ICD-10-CM | POA: Diagnosis not present

## 2020-06-30 LAB — SARS CORONAVIRUS 2 (TAT 6-24 HRS): SARS Coronavirus 2: NEGATIVE

## 2020-07-02 ENCOUNTER — Ambulatory Visit (HOSPITAL_BASED_OUTPATIENT_CLINIC_OR_DEPARTMENT_OTHER): Payer: BC Managed Care – PPO | Admitting: Anesthesiology

## 2020-07-02 ENCOUNTER — Encounter (HOSPITAL_BASED_OUTPATIENT_CLINIC_OR_DEPARTMENT_OTHER): Payer: Self-pay | Admitting: Orthopedic Surgery

## 2020-07-02 ENCOUNTER — Other Ambulatory Visit: Payer: Self-pay

## 2020-07-02 ENCOUNTER — Encounter (HOSPITAL_BASED_OUTPATIENT_CLINIC_OR_DEPARTMENT_OTHER)
Admission: RE | Disposition: A | Discharge status: Home / Self Care | Payer: Self-pay | Source: Home / Self Care | Attending: Orthopedic Surgery

## 2020-07-02 ENCOUNTER — Ambulatory Visit (HOSPITAL_BASED_OUTPATIENT_CLINIC_OR_DEPARTMENT_OTHER)
Admission: RE | Admit: 2020-07-02 | Discharge: 2020-07-02 | Disposition: A | Discharge status: Home / Self Care | Payer: BC Managed Care – PPO | Attending: Orthopedic Surgery | Admitting: Orthopedic Surgery

## 2020-07-02 DIAGNOSIS — M21612 Bunion of left foot: Secondary | ICD-10-CM | POA: Insufficient documentation

## 2020-07-02 DIAGNOSIS — Z20822 Contact with and (suspected) exposure to covid-19: Secondary | ICD-10-CM | POA: Insufficient documentation

## 2020-07-02 DIAGNOSIS — Z91048 Other nonmedicinal substance allergy status: Secondary | ICD-10-CM | POA: Insufficient documentation

## 2020-07-02 HISTORY — PX: BUNIONECTOMY: SHX129

## 2020-07-02 LAB — POCT PREGNANCY, URINE: Preg Test, Ur: NEGATIVE

## 2020-07-02 SURGERY — BUNIONECTOMY
Anesthesia: General | Site: Toe | Laterality: Left

## 2020-07-02 MED ORDER — HYDROMORPHONE HCL 1 MG/ML IJ SOLN: 0.2500 mg | INTRAMUSCULAR | Status: DC | PRN

## 2020-07-02 MED ORDER — CEFAZOLIN SODIUM-DEXTROSE 2-4 GM/100ML-% IV SOLN
2.0000 g | INTRAVENOUS | Status: AC
  Administered 2020-07-02: 2 g via INTRAVENOUS

## 2020-07-02 MED ORDER — 0.9 % SODIUM CHLORIDE (POUR BTL) OPTIME
TOPICAL | Status: DC | PRN
  Administered 2020-07-02: 1000 mL

## 2020-07-02 MED ORDER — OXYCODONE HCL 5 MG/5ML PO SOLN: 5.0000 mg | Freq: Once | ORAL | Status: DC | PRN

## 2020-07-02 MED ORDER — METOPROLOL TARTRATE 5 MG/5ML IV SOLN
INTRAVENOUS | Status: DC | PRN
  Administered 2020-07-02 (×2): 1 mg via INTRAVENOUS

## 2020-07-02 MED ORDER — FENTANYL CITRATE (PF) 100 MCG/2ML IJ SOLN
100.0000 ug | Freq: Once | INTRAMUSCULAR | Status: AC
Start: 2020-07-02 — End: 2020-07-02
  Administered 2020-07-02: 100 ug via INTRAVENOUS

## 2020-07-02 MED ORDER — MEPERIDINE HCL 25 MG/ML IJ SOLN: 6.2500 mg | INTRAMUSCULAR | Status: DC | PRN

## 2020-07-02 MED ORDER — SCOPOLAMINE 1 MG/3DAYS TD PT72
MEDICATED_PATCH | TRANSDERMAL | Status: AC
  Filled 2020-07-02: qty 1

## 2020-07-02 MED ORDER — SODIUM CHLORIDE 0.9 % IV SOLN: INTRAVENOUS | Status: DC

## 2020-07-02 MED ORDER — SUGAMMADEX SODIUM 200 MG/2ML IV SOLN
INTRAVENOUS | Status: DC | PRN
  Administered 2020-07-02: 200 mg via INTRAVENOUS

## 2020-07-02 MED ORDER — LIDOCAINE 2% (20 MG/ML) 5 ML SYRINGE
INTRAMUSCULAR | Status: AC
  Filled 2020-07-02: qty 5

## 2020-07-02 MED ORDER — ONDANSETRON HCL 4 MG/2ML IJ SOLN
INTRAMUSCULAR | Status: AC
  Filled 2020-07-02: qty 2

## 2020-07-02 MED ORDER — CEFAZOLIN SODIUM-DEXTROSE 2-4 GM/100ML-% IV SOLN
INTRAVENOUS | Status: AC
  Filled 2020-07-02: qty 100

## 2020-07-02 MED ORDER — DEXAMETHASONE SODIUM PHOSPHATE 10 MG/ML IJ SOLN
INTRAMUSCULAR | Status: AC
  Filled 2020-07-02: qty 1

## 2020-07-02 MED ORDER — ROCURONIUM BROMIDE 100 MG/10ML IV SOLN
INTRAVENOUS | Status: DC | PRN
  Administered 2020-07-02: 40 mg via INTRAVENOUS

## 2020-07-02 MED ORDER — SCOPOLAMINE 1 MG/3DAYS TD PT72
1.0000 | MEDICATED_PATCH | TRANSDERMAL | Status: DC
  Administered 2020-07-02: 1.5 mg via TRANSDERMAL

## 2020-07-02 MED ORDER — PROPOFOL 10 MG/ML IV BOLUS
INTRAVENOUS | Status: AC
  Filled 2020-07-02: qty 20

## 2020-07-02 MED ORDER — LIDOCAINE 2% (20 MG/ML) 5 ML SYRINGE
INTRAMUSCULAR | Status: DC | PRN
  Administered 2020-07-02: 60 mg via INTRAVENOUS

## 2020-07-02 MED ORDER — MIDAZOLAM HCL 2 MG/2ML IJ SOLN
2.0000 mg | Freq: Once | INTRAMUSCULAR | Status: AC
  Administered 2020-07-02: 2 mg via INTRAVENOUS

## 2020-07-02 MED ORDER — FENTANYL CITRATE (PF) 100 MCG/2ML IJ SOLN
INTRAMUSCULAR | Status: DC | PRN
  Administered 2020-07-02 (×2): 50 ug via INTRAVENOUS

## 2020-07-02 MED ORDER — AMISULPRIDE (ANTIEMETIC) 5 MG/2ML IV SOLN: 10.0000 mg | Freq: Once | INTRAVENOUS | Status: DC | PRN

## 2020-07-02 MED ORDER — OXYCODONE HCL 5 MG PO TABS: 5.0000 mg | ORAL_TABLET | Freq: Four times a day (QID) | ORAL | 0 refills | Status: AC | PRN

## 2020-07-02 MED ORDER — BUPIVACAINE HCL (PF) 0.5 % IJ SOLN
INTRAMUSCULAR | Status: DC | PRN
  Administered 2020-07-02: 30 mL via PERINEURAL

## 2020-07-02 MED ORDER — ONDANSETRON HCL 4 MG/2ML IJ SOLN
INTRAMUSCULAR | Status: DC | PRN
  Administered 2020-07-02: 4 mg via INTRAVENOUS

## 2020-07-02 MED ORDER — LACTATED RINGERS IV SOLN: INTRAVENOUS | Status: DC

## 2020-07-02 MED ORDER — MIDAZOLAM HCL 2 MG/2ML IJ SOLN
INTRAMUSCULAR | Status: AC
  Filled 2020-07-02: qty 2

## 2020-07-02 MED ORDER — PROMETHAZINE HCL 25 MG/ML IJ SOLN: 6.2500 mg | INTRAMUSCULAR | Status: DC | PRN

## 2020-07-02 MED ORDER — KETOROLAC TROMETHAMINE 30 MG/ML IJ SOLN
30.0000 mg | Freq: Once | INTRAMUSCULAR | Status: DC | PRN
Start: 2020-07-02 — End: 2020-07-02

## 2020-07-02 MED ORDER — DEXAMETHASONE SODIUM PHOSPHATE 10 MG/ML IJ SOLN
INTRAMUSCULAR | Status: DC | PRN
  Administered 2020-07-02: 10 mg via INTRAVENOUS

## 2020-07-02 MED ORDER — OXYCODONE HCL 5 MG PO TABS
5.0000 mg | ORAL_TABLET | Freq: Once | ORAL | Status: DC | PRN
Start: 2020-07-02 — End: 2020-07-02

## 2020-07-02 MED ORDER — ACETAMINOPHEN 500 MG PO TABS
1000.0000 mg | ORAL_TABLET | Freq: Once | ORAL | Status: AC
  Administered 2020-07-02: 1000 mg via ORAL

## 2020-07-02 MED ORDER — VANCOMYCIN HCL 500 MG IV SOLR
INTRAVENOUS | Status: DC | PRN
  Administered 2020-07-02: 500 mg

## 2020-07-02 MED ORDER — OXYCODONE HCL 5 MG PO TABS: 5.0000 mg | ORAL_TABLET | Freq: Once | ORAL | Status: DC | PRN

## 2020-07-02 MED ORDER — FENTANYL CITRATE (PF) 100 MCG/2ML IJ SOLN
INTRAMUSCULAR | Status: AC
  Filled 2020-07-02: qty 2

## 2020-07-02 MED ORDER — PROPOFOL 10 MG/ML IV BOLUS
INTRAVENOUS | Status: DC | PRN
  Administered 2020-07-02: 200 mg via INTRAVENOUS
  Administered 2020-07-02: 70 mg via INTRAVENOUS

## 2020-07-02 MED ORDER — PROPOFOL 500 MG/50ML IV EMUL
INTRAVENOUS | Status: AC
  Filled 2020-07-02: qty 50

## 2020-07-02 MED ORDER — ACETAMINOPHEN 500 MG PO TABS
ORAL_TABLET | ORAL | Status: AC
  Filled 2020-07-02: qty 2

## 2020-07-02 MED ORDER — SUCCINYLCHOLINE CHLORIDE 20 MG/ML IJ SOLN
INTRAMUSCULAR | Status: DC | PRN
  Administered 2020-07-02: 140 mg via INTRAVENOUS

## 2020-07-02 SURGICAL SUPPLY — 70 items
APL PRP STRL LF DISP 70% ISPRP (MISCELLANEOUS) ×1
BIT DRILL 1.5X85 (BIT) ×1 IMPLANT
BLADE LONG MED 25X9 (BLADE) ×1 IMPLANT
BLADE MINI RND TIP GREEN BEAV (BLADE) IMPLANT
BLADE SURG 15 STRL LF DISP TIS (BLADE) ×3 IMPLANT
BLADE SURG 15 STRL SS (BLADE) ×6
BNDG COHESIVE 4X5 TAN STRL (GAUZE/BANDAGES/DRESSINGS) ×2 IMPLANT
BNDG COHESIVE 6X5 TAN STRL LF (GAUZE/BANDAGES/DRESSINGS) IMPLANT
BNDG CONFORM 2 STRL LF (GAUZE/BANDAGES/DRESSINGS) IMPLANT
BNDG CONFORM 3 STRL LF (GAUZE/BANDAGES/DRESSINGS) ×2 IMPLANT
CHLORAPREP W/TINT 26 (MISCELLANEOUS) ×2 IMPLANT
COVER BACK TABLE 60X90IN (DRAPES) ×2 IMPLANT
COVER WAND RF STERILE (DRAPES) IMPLANT
CUFF TOURN SGL QUICK 24 (TOURNIQUET CUFF)
CUFF TOURN SGL QUICK 34 (TOURNIQUET CUFF)
CUFF TRNQT CYL 24X4X16.5-23 (TOURNIQUET CUFF) IMPLANT
CUFF TRNQT CYL 34X4.125X (TOURNIQUET CUFF) IMPLANT
DRAPE EXTREMITY T 121X128X90 (DISPOSABLE) ×2 IMPLANT
DRAPE OEC MINIVIEW 54X84 (DRAPES) ×2 IMPLANT
DRAPE U-SHAPE 47X51 STRL (DRAPES) ×2 IMPLANT
DRSG MEPITEL 4X7.2 (GAUZE/BANDAGES/DRESSINGS) ×2 IMPLANT
DRSG PAD ABDOMINAL 8X10 ST (GAUZE/BANDAGES/DRESSINGS) ×2 IMPLANT
GAUZE SPONGE 4X4 12PLY STRL (GAUZE/BANDAGES/DRESSINGS) ×2 IMPLANT
GLOVE SRG 8 PF TXTR STRL LF DI (GLOVE) ×2 IMPLANT
GLOVE SURG ENC MOIS LTX SZ8 (GLOVE) ×2 IMPLANT
GLOVE SURG LTX SZ8 (GLOVE) ×2 IMPLANT
GLOVE SURG UNDER POLY LF SZ8 (GLOVE) ×4
GOWN STRL REUS W/ TWL LRG LVL3 (GOWN DISPOSABLE) ×1 IMPLANT
GOWN STRL REUS W/ TWL XL LVL3 (GOWN DISPOSABLE) ×2 IMPLANT
GOWN STRL REUS W/TWL LRG LVL3 (GOWN DISPOSABLE) ×2
GOWN STRL REUS W/TWL XL LVL3 (GOWN DISPOSABLE) ×4
K-WIRE DBL END .054 LG (WIRE) ×2 IMPLANT
NDL HYPO 25X1 1.5 SAFETY (NEEDLE) IMPLANT
NEEDLE HYPO 22GX1.5 SAFETY (NEEDLE) IMPLANT
NEEDLE HYPO 25X1 1.5 SAFETY (NEEDLE) IMPLANT
NS IRRIG 1000ML POUR BTL (IV SOLUTION) ×2 IMPLANT
PACK BASIN DAY SURGERY FS (CUSTOM PROCEDURE TRAY) ×2 IMPLANT
PAD CAST 4YDX4 CTTN HI CHSV (CAST SUPPLIES) ×1 IMPLANT
PADDING CAST ABS 4INX4YD NS (CAST SUPPLIES)
PADDING CAST ABS COTTON 4X4 ST (CAST SUPPLIES) IMPLANT
PADDING CAST COTTON 4X4 STRL (CAST SUPPLIES) ×2
PADDING CAST COTTON 6X4 STRL (CAST SUPPLIES) IMPLANT
PENCIL SMOKE EVACUATOR (MISCELLANEOUS) ×2 IMPLANT
SANITIZER HAND PURELL 535ML FO (MISCELLANEOUS) ×2 IMPLANT
SCREW CORT FT ST 2X14 (Screw) IMPLANT
SCREW CORT ST MINI HEX 2.0X16 (Screw) ×1 IMPLANT
SCREW CORT ST MINI HEX 2X22 (Screw) ×1 IMPLANT
SCREW CORTICAL 2.0X14 (Screw) ×1 IMPLANT
SCREW CORTICAL ST2.0X24 (Screw) ×1 IMPLANT
SHEET MEDIUM DRAPE 40X70 STRL (DRAPES) ×2 IMPLANT
SLEEVE SCD COMPRESS KNEE MED (STOCKING) ×2 IMPLANT
SPLINT FAST PLASTER 5X30 (CAST SUPPLIES)
SPLINT PLASTER CAST FAST 5X30 (CAST SUPPLIES) IMPLANT
SPONGE LAP 18X18 RF (DISPOSABLE) ×2 IMPLANT
STOCKINETTE 6  STRL (DRAPES) ×2
STOCKINETTE 6 STRL (DRAPES) ×1 IMPLANT
SUCTION FRAZIER HANDLE 10FR (MISCELLANEOUS) ×2
SUCTION TUBE FRAZIER 10FR DISP (MISCELLANEOUS) ×1 IMPLANT
SUT ETHILON 3 0 PS 1 (SUTURE) ×2 IMPLANT
SUT MNCRL AB 3-0 PS2 18 (SUTURE) ×2 IMPLANT
SUT VIC AB 0 SH 27 (SUTURE) IMPLANT
SUT VIC AB 2-0 SH 27 (SUTURE) ×2
SUT VIC AB 2-0 SH 27XBRD (SUTURE) IMPLANT
SUT VICRYL 0 UR6 27IN ABS (SUTURE) IMPLANT
SYR BULB EAR ULCER 3OZ GRN STR (SYRINGE) ×2 IMPLANT
SYR CONTROL 10ML LL (SYRINGE) IMPLANT
TOWEL GREEN STERILE FF (TOWEL DISPOSABLE) ×4 IMPLANT
TUBE CONNECTING 20X1/4 (TUBING) IMPLANT
UNDERPAD 30X36 HEAVY ABSORB (UNDERPADS AND DIAPERS) ×2 IMPLANT
YANKAUER SUCT BULB TIP NO VENT (SUCTIONS) ×1 IMPLANT

## 2020-07-02 NOTE — Anesthesia Procedure Notes (Signed)
Procedure Name: Intubation Date/Time: 07/02/2020 3:12 PM Performed by: Lauralyn Primes, CRNA Pre-anesthesia Checklist: Patient identified, Emergency Drugs available, Suction available and Patient being monitored Patient Re-evaluated:Patient Re-evaluated prior to induction Oxygen Delivery Method: Circle system utilized Preoxygenation: Pre-oxygenation with 100% oxygen Induction Type: IV induction Ventilation: Mask ventilation without difficulty Laryngoscope Size: Glidescope and 3 Grade View: Grade I Tube type: Oral Tube size: 7.0 mm Number of attempts: 1 Airway Equipment and Method: Stylet,  Oral airway and Bite block Placement Confirmation: ETT inserted through vocal cords under direct vision,  positive ETCO2 and breath sounds checked- equal and bilateral Secured at: 21 cm Tube secured with: Tape Dental Injury: Teeth and Oropharynx as per pre-operative assessment

## 2020-07-02 NOTE — Op Note (Signed)
07/02/2020  4:30 PM  PATIENT:  Alison Morton  30 y.o. female  PRE-OPERATIVE DIAGNOSIS:  Left foot bunion  POST-OPERATIVE DIAGNOSIS:  Left foot bunion  Procedure(s): 1.  Left modified McBride bunionectomy   2.  Left 1st MT scarf osteotomy and hallux proximal phalanx Akin osteotomy   3.  Left foot AP and lateral xrays  SURGEON:  Toni Arthurs, MD  ASSISTANT: none  ANESTHESIA:   General, regional  EBL:  minimal   TOURNIQUET:   Total Tourniquet Time Documented: Thigh (Right) - 46 minutes Total: Thigh (Right) - 46 minutes  COMPLICATIONS:  None apparent  DISPOSITION:  Extubated, awake and stable to recovery.  INDICATION FOR PROCEDURE: The patient is a 30 year old female without significant past medical history.  She has a long history of left forefoot pain due to a prominent bunion deformity.  She has failed nonoperative treatment including activity modification, oral anti-inflammatories and shoewear modification.  She is presents now for surgical correction of this painful left foot bunion deformity.  The risks and benefits of the alternative treatment options have been discussed in detail.  The patient wishes to proceed with surgery and specifically understands risks of bleeding, infection, nerve damage, blood clots, need for additional surgery, amputation and death.  PROCEDURE IN DETAIL:  After pre operative consent was obtained, and the correct operative site was identified, the patient was brought to the operating room and placed supine on the OR table.  Anesthesia was administered.  Pre-operative antibiotics were administered.  A surgical timeout was taken.  The left lower extremity was prepped and draped in standard sterile fashion with a tourniquet around the thigh.  The extremity was elevated and the tourniquet was inflated to 250 mmHg.  A longitudinal incision was made at the dorsum of the first webspace.  Dissection was carried sharply down through the subcutaneous tissues.   The intermetatarsal ligament was divided under direct vision.  An arthrotomy was then made between the lateral sesamoid and the metatarsal head.  Attention was turned to the medial forefoot where a longitudinal incision was made over the medial eminence and extending along the first metatarsal shaft.  Dissection was carried sharply down through the subcutaneous tissues.  The medial joint capsule was incised and elevated dorsally and plantarly.  The hypertrophic medial eminence was resected in line with the first metatarsal shaft using an oscillating saw.  A scarf osteotomy was then cut in the metatarsal shaft after marking the corners with K wires.  Small wedge of bone was removed proximally.  The osteotomy was mobilized and the head of the metatarsal was translated laterally correcting the intermetatarsal and hallux valgus angles.  The osteotomy was fixed with 2 [employer] stainless steel 2 mm mini frag screws.  Overhanging bone was trimmed with an oscillating saw.  Radiographs confirmed appropriate correction of the bunion deformity.  There was a bit of residual hallux valgus interphalangeus.  The decision was made to proceed with an Akin osteotomy.  Dissection was carried along the medial proximal phalanx of the hallux.  The flexor and extensor tendons were protected.  A closing wedge osteotomy was made at the base of the proximal phalanx.  The osteotomy was fixed with a 2 mm stainless steel screw.  AP and lateral radiographs confirmed appropriate correction of the bunion deformity and appropriate position and length of all hardware.  The wounds were irrigated copiously.  The medial joint capsule was repaired with imbricating sutures of 2-0 Vicryl.  Vancomycin powder was sprinkled in  the wounds.  Subcutaneous tissues were approximated with Vicryl.  Skin incisions were closed with nylon.  Sterile dressings were applied followed by a bunion wrap.  The tourniquet was released after application of the  dressings.  The patient was awakened from anesthesia and transported to the recovery room in stable condition.   FOLLOW UP PLAN: Weightbearing as tolerated in a flat postop shoe.  Follow-up in the office in 2 weeks for suture removal and conversion to a toe spacer.   RADIOGRAPHS: AP and lateral radiographs of the left foot are obtained intraoperatively.  These show interval correction of the bunion deformity with scarf and Akin osteotomies.  Hardware is appropriately positioned and of the appropriate lengths.  No other acute injuries are noted.

## 2020-07-02 NOTE — Transfer of Care (Signed)
Immediate Anesthesia Transfer of Care Note  Patient: Alison Morton  Procedure(s) Performed: Left 1st metatarsal scarf osteotomy, modified McBride bunionectomy and akin osteotomy of the hallux (Left Toe)  Patient Location: PACU  Anesthesia Type:GA combined with regional for post-op pain  Level of Consciousness: awake, alert  and oriented  Airway & Oxygen Therapy: Patient Spontanous Breathing and Patient connected to face mask oxygen  Post-op Assessment: Report given to RN and Post -op Vital signs reviewed and stable  Post vital signs: Reviewed and stable  Last Vitals:  Vitals Value Taken Time  BP 147/99 07/02/20 1622  Temp    Pulse 98 07/02/20 1626  Resp 14 07/02/20 1626  SpO2 100 % 07/02/20 1626  Vitals shown include unvalidated device data.  Last Pain:  Vitals:   07/02/20 1308  TempSrc: Oral  PainSc: 0-No pain         Complications: No complications documented.

## 2020-07-02 NOTE — Progress Notes (Signed)
Assisted Dr. Miller with left, ultrasound guided, popliteal block. Side rails up, monitors on throughout procedure. See vital signs in flow sheet. Tolerated Procedure well. 

## 2020-07-02 NOTE — Discharge Instructions (Signed)
Toni Arthurs, MD [facility]  Please read the following information regarding your care after surgery.  Medications  You only need a prescription for the narcotic pain medicine (ex. oxycodone, Percocet, Norco).  All of the other medicines listed below are available over the counter. X Aleve 2 pills twice a day for the first 3 days after surgery. X acetominophen (Tylenol) 650 mg every 4-6 hours as you need for minor to moderate pain X oxycodone as prescribed for severe pain  Narcotic pain medicine (ex. oxycodone, Percocet, Vicodin) will cause constipation.  To prevent this problem, take the following medicines while you are taking any pain medicine. X docusate sodium (Colace) 100 mg twice a day X senna (Senokot) 2 tablets twice a day  ? To help prevent blood clots, take a baby aspirin (81 mg) twice a day for two weeks after surgery.  You should also get up every hour while you are awake to move around.    Weight Bearing ? Bear weight when you are able on your operated leg or foot. X Bear weight only on your operated foot in the post-op shoe. ? Do not bear any weight on the operated leg or foot.  Cast / Splint / Dressing X Keep your splint, cast or dressing clean and dry.  Don't put anything (coat hanger, pencil, etc) down inside of it.  If it gets damp, use a hair dryer on the cool setting to dry it.  If it gets soaked, call the office to schedule an appointment for a cast change. ? Remove your dressing 3 days after surgery and cover the incisions with dry dressings.    After your dressing, cast or splint is removed; you may shower, but do not soak or scrub the wound.  Allow the water to run over it, and then gently pat it dry.  Swelling It is normal for you to have swelling where you had surgery.  To reduce swelling and pain, keep your toes above your nose for at least 3 days after surgery.  It may be necessary to keep your foot or leg elevated for several weeks.  If it hurts, it should be  elevated.  Follow Up Call my office at [phone] when you are discharged from the hospital or surgery center to schedule an appointment to be seen two weeks after surgery.  Call my office at [phone] if you develop a fever >101.5 F, nausea, vomiting, bleeding from the surgical site or severe pain.      May take Tylenol after 7:15pm, if needed.    Post Anesthesia Home Care Instructions  Activity: Get plenty of rest for the remainder of the day. A responsible individual must stay with you for 24 hours following the procedure.  For the next 24 hours, DO NOT: -Drive a car -Advertising copywriter -Drink alcoholic beverages -Take any medication unless instructed by your physician -Make any legal decisions or sign important papers.  Meals: Start with liquid foods such as gelatin or soup. Progress to regular foods as tolerated. Avoid greasy, spicy, heavy foods. If nausea and/or vomiting occur, drink only clear liquids until the nausea and/or vomiting subsides. Call your physician if vomiting continues.  Special Instructions/Symptoms: Your throat may feel dry or sore from the anesthesia or the breathing tube placed in your throat during surgery. If this causes discomfort, gargle with warm salt water. The discomfort should disappear within 24 hours.  If you had a scopolamine patch placed behind your ear for the management of post- operative  nausea and/or vomiting:  1. The medication in the patch is effective for 72 hours, after which it should be removed.  Wrap patch in a tissue and discard in the trash. Wash hands thoroughly with soap and water. 2. You may remove the patch earlier than 72 hours if you experience unpleasant side effects which may include dry mouth, dizziness or visual disturbances. 3. Avoid touching the patch. Wash your hands with soap and water after contact with the patch.

## 2020-07-02 NOTE — Anesthesia Procedure Notes (Signed)
Anesthesia Regional Block: Popliteal block   Pre-Anesthetic Checklist: ,, timeout performed, Correct Patient, Correct Site, Correct Laterality, Correct Procedure, Correct Position, site marked, Risks and benefits discussed,  Surgical consent,  Pre-op evaluation,  At surgeon's request and post-op pain management  Laterality: Left  Prep: chloraprep       Needles:  Injection technique: Single-shot  Needle Type: Stimiplex     Needle Length: 9cm  Needle Gauge: 21     Additional Needles:   Procedures:,,,, ultrasound used (permanent image in chart),,,,  Narrative:  Start time: 07/02/2020 2:06 PM End time: 07/02/2020 2:11 PM Injection made incrementally with aspirations every 5 mL.  Performed by: Personally  Anesthesiologist: Lowella Curb, MD

## 2020-07-02 NOTE — Anesthesia Postprocedure Evaluation (Signed)
Anesthesia Post Note  Patient: Alison Morton  Procedure(s) Performed: Left 1st metatarsal scarf osteotomy, modified McBride bunionectomy and akin osteotomy of the hallux (Left Toe)     Patient location during evaluation: PACU Anesthesia Type: General Level of consciousness: awake and alert Pain management: pain level controlled Vital Signs Assessment: post-procedure vital signs reviewed and stable Respiratory status: spontaneous breathing, nonlabored ventilation and respiratory function stable Cardiovascular status: blood pressure returned to baseline and stable Postop Assessment: no apparent nausea or vomiting Anesthetic complications: no   No complications documented.  Last Vitals:  Vitals:   07/02/20 1645 07/02/20 1703  BP: (!) 142/90 129/81  Pulse: 90 60  Resp: (!) 21 20  Temp:  36.9 C  SpO2: 100% 100%    Last Pain:  Vitals:   07/02/20 1703  TempSrc:   PainSc: 0-No pain                 Lowella Curb

## 2020-07-02 NOTE — Anesthesia Preprocedure Evaluation (Signed)
Anesthesia Evaluation  Patient identified by MRN, date of birth, ID band Patient awake    Reviewed: Allergy & Precautions, NPO status , Patient's Chart, lab work & pertinent test results  Airway Mallampati: II  TM Distance: >3 FB Neck ROM: Full    Dental no notable dental hx.    Pulmonary neg pulmonary ROS,    Pulmonary exam normal breath sounds clear to auscultation       Cardiovascular negative cardio ROS Normal cardiovascular exam Rhythm:Regular Rate:Normal     Neuro/Psych negative neurological ROS  negative psych ROS   GI/Hepatic negative GI ROS, Neg liver ROS,   Endo/Other  negative endocrine ROS  Renal/GU negative Renal ROS  negative genitourinary   Musculoskeletal negative musculoskeletal ROS (+)   Abdominal (+) + obese,   Peds negative pediatric ROS (+)  Hematology negative hematology ROS (+)   Anesthesia Other Findings   Reproductive/Obstetrics negative OB ROS                             Anesthesia Physical  Anesthesia Plan  ASA: II  Anesthesia Plan: General   Post-op Pain Management:  Regional for Post-op pain   Induction: Intravenous  PONV Risk Score and Plan: 3 and Ondansetron, Treatment may vary due to age or medical condition, Dexamethasone and Midazolam  Airway Management Planned: LMA  Additional Equipment:   Intra-op Plan:   Post-operative Plan: Extubation in OR  Informed Consent: I have reviewed the patients History and Physical, chart, labs and discussed the procedure including the risks, benefits and alternatives for the proposed anesthesia with the patient or authorized representative who has indicated his/her understanding and acceptance.     Dental advisory given  Plan Discussed with: CRNA  Anesthesia Plan Comments:         Anesthesia Quick Evaluation

## 2020-07-02 NOTE — H&P (Signed)
Alison Morton is an 30 y.o. female.   Chief Complaint: Left foot pain HPI: The patient is a 30 year old female without significant past medical history.  She has a long history of left forefoot pain due to a prominent bunion deformity.  She has failed nonoperative treatment to date and presents today for surgical correction of this painful left forefoot deformity.  Past Medical History:  Diagnosis Date  . Ganglion cyst of dorsum of left wrist 04/2017    Past Surgical History:  Procedure Laterality Date  . CARPAL TUNNEL RELEASE Left   . GANGLION CYST EXCISION Left    wrist  . GANGLION CYST EXCISION Left 05/10/2017   Procedure: REMOVAL GANGLION CYST LEFT WRIST;  Surgeon: Tarry Kos, MD;  Location:  [facility];  Service: Orthopedics;  Laterality: Left;  . TUBAL LIGATION Bilateral 03/14/2017   Procedure: POST PARTUM TUBAL LIGATION;  Surgeon: Edwinna Areola, DO;  Location: [facility];  Service: Gynecology;  Laterality: Bilateral;  . WISDOM TOOTH EXTRACTION      Family History  Problem Relation Age of Onset  . Stroke Maternal Grandfather   . Cancer Maternal Grandfather        breast  . Hypertension Father    Social History:  reports that she has never smoked. She has never used smokeless tobacco. She reports that she does not drink alcohol and does not use drugs.  Allergies:  Allergies  Allergen Reactions  . Nickel Itching and Rash    No medications prior to admission.    Results for orders placed or performed during the hospital encounter of 07-08-2020 (from the past 48 hour(s))  Pregnancy, urine POC     Status: None   Collection Time: Jul 08, 2020 12:55 PM  Result Value Ref Range   Preg Test, Ur NEGATIVE NEGATIVE    Comment:        THE SENSITIVITY OF THIS METHODOLOGY IS >24 mIU/mL    No results found.  Review of Systems no recent fever, chills, nausea, vomiting or changes in her appetite  Blood pressure 130/84, pulse (!) 59, temperature  98.5 F (36.9 C), temperature source Oral, resp. rate (!) 22, height 5\' 3"  (1.6 m), weight 97.2 kg, SpO2 100 %. Physical Exam  Well-nourished well-developed woman in no apparent distress.  Alert and oriented x4.  Normal mood and affect.  Gait is normal.  Left foot has a prominent bunion deformity.  Skin is healthy and intact.  Pulses are palpable in the foot.  No lymphadenopathy.  5 out of 5 strength in plantarflexion and dorsiflexion of the ankle and toes.   Assessment/Plan Painful left foot bunion deformity -to the operating room today for modified McBride bunionectomy, scarf osteotomy and Akin osteotomy.  The risks and benefits of the alternative treatment options have been discussed in detail.  The patient wishes to proceed with surgery and specifically understands risks of bleeding, infection, nerve damage, blood clots, need for additional surgery, amputation and death.   , MD 2020/07/08, 2:52 PM

## 2020-07-02 NOTE — Anesthesia Preprocedure Evaluation (Deleted)
Anesthesia Evaluation  Patient identified by MRN, date of birth, ID band Patient awake    Reviewed: Allergy & Precautions, NPO status , Patient's Chart, lab work & pertinent test results  Airway Mallampati: II  TM Distance: >3 FB Neck ROM: Full    Dental no notable dental hx.    Pulmonary neg pulmonary ROS,    Pulmonary exam normal breath sounds clear to auscultation       Cardiovascular negative cardio ROS Normal cardiovascular exam Rhythm:Regular Rate:Normal     Neuro/Psych negative neurological ROS  negative psych ROS   GI/Hepatic negative GI ROS, Neg liver ROS,   Endo/Other  negative endocrine ROSoesity BMI 37  Renal/GU negative Renal ROS  negative genitourinary   Musculoskeletal negative musculoskeletal ROS (+) L foot bunion   Abdominal (+) + obese,   Peds negative pediatric ROS (+)  Hematology negative hematology ROS (+)   Anesthesia Other Findings   Reproductive/Obstetrics negative OB ROS                                                             Anesthesia Evaluation  Patient identified by MRN, date of birth, ID band Patient awake    Reviewed: Allergy & Precautions, NPO status , Patient's Chart, lab work & pertinent test results  Airway Mallampati: II  TM Distance: >3 FB Neck ROM: Full    Dental no notable dental hx.    Pulmonary neg pulmonary ROS,    Pulmonary exam normal breath sounds clear to auscultation       Cardiovascular negative cardio ROS Normal cardiovascular exam Rhythm:Regular Rate:Normal     Neuro/Psych negative neurological ROS  negative psych ROS   GI/Hepatic negative GI ROS, Neg liver ROS,   Endo/Other  negative endocrine ROS  Renal/GU negative Renal ROS  negative genitourinary   Musculoskeletal negative musculoskeletal ROS (+)   Abdominal   Peds negative pediatric ROS (+)  Hematology negative hematology ROS (+)    Anesthesia Other Findings   Reproductive/Obstetrics negative OB ROS                             Anesthesia Physical Anesthesia Plan  ASA: I  Anesthesia Plan: General   Post-op Pain Management:    Induction: Intravenous  PONV Risk Score and Plan: 3 and Ondansetron and Treatment may vary due to age or medical condition  Airway Management Planned: LMA  Additional Equipment:   Intra-op Plan:   Post-operative Plan: Extubation in OR  Informed Consent: I have reviewed the patients History and Physical, chart, labs and discussed the procedure including the risks, benefits and alternatives for the proposed anesthesia with the patient or authorized representative who has indicated his/her understanding and acceptance.   Dental advisory given  Plan Discussed with: CRNA  Anesthesia Plan Comments:         Anesthesia Quick Evaluation  Anesthesia Physical Anesthesia Plan  ASA: II  Anesthesia Plan: General and Regional   Post-op Pain Management: GA combined w/ Regional for post-op pain   Induction: Intravenous  PONV Risk Score and Plan: 3 and Ondansetron, Dexamethasone, Midazolam and Treatment may vary due to age or medical condition  Airway Management Planned: LMA  Additional Equipment:   Intra-op Plan:   Post-operative Plan: Extubation in  OR  Informed Consent:   Plan Discussed with:   Anesthesia Plan Comments:        Anesthesia Quick Evaluation

## 2020-07-08 ENCOUNTER — Encounter (HOSPITAL_BASED_OUTPATIENT_CLINIC_OR_DEPARTMENT_OTHER): Payer: Self-pay | Admitting: Orthopedic Surgery

## 2023-04-26 ENCOUNTER — Emergency Department (HOSPITAL_BASED_OUTPATIENT_CLINIC_OR_DEPARTMENT_OTHER)
Admission: EM | Admit: 2023-04-26 | Discharge: 2023-04-26 | Disposition: A | Discharge status: Home / Self Care | Payer: BC Managed Care – PPO | Attending: Emergency Medicine | Admitting: Emergency Medicine

## 2023-04-26 ENCOUNTER — Other Ambulatory Visit: Payer: Self-pay

## 2023-04-26 ENCOUNTER — Encounter (HOSPITAL_BASED_OUTPATIENT_CLINIC_OR_DEPARTMENT_OTHER): Payer: Self-pay | Admitting: Emergency Medicine

## 2023-04-26 DIAGNOSIS — J019 Acute sinusitis, unspecified: Secondary | ICD-10-CM | POA: Diagnosis not present

## 2023-04-26 DIAGNOSIS — Z20822 Contact with and (suspected) exposure to covid-19: Secondary | ICD-10-CM | POA: Diagnosis not present

## 2023-04-26 DIAGNOSIS — R059 Cough, unspecified: Secondary | ICD-10-CM | POA: Diagnosis present

## 2023-04-26 LAB — RESP PANEL BY RT-PCR (RSV, FLU A&B, COVID)  RVPGX2
Influenza A by PCR: NEGATIVE
Influenza B by PCR: NEGATIVE
Resp Syncytial Virus by PCR: NEGATIVE
SARS Coronavirus 2 by RT PCR: NEGATIVE

## 2023-04-26 MED ORDER — AMOXICILLIN-POT CLAVULANATE 875-125 MG PO TABS: 1.0000 | ORAL_TABLET | Freq: Two times a day (BID) | ORAL | 0 refills | Status: AC

## 2023-04-26 MED ORDER — ONDANSETRON 4 MG PO TBDP: 4.0000 mg | ORAL_TABLET | Freq: Three times a day (TID) | ORAL | 0 refills | Status: AC | PRN

## 2023-04-26 MED ORDER — FLUCONAZOLE 200 MG PO TABS: 200.0000 mg | ORAL_TABLET | Freq: Every day | ORAL | 0 refills | Status: AC

## 2023-04-26 NOTE — ED Provider Notes (Signed)
[facility] Provider Note   CSN: [id number] Arrival date & time: 04/26/23  0820     History Chief Complaint  Patient presents with   Cough    KYANNA MAHRT is a 33 y.o. female.  Patient presents to the emergency department with concerns of a cough.  She endorses cough, congestion, headache since Thursday last week.  She denies any fever or shortness of breath.  No chest pain.  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  She has been trying to take Mucinex, Sudafed, and other over-the-counter medications without significant proved in symptoms.  Denies significant facial pain or facial congestion.   Cough      Home Medications Prior to Admission medications   Medication Sig Start Date End Date Taking? Authorizing Provider  amoxicillin-clavulanate (AUGMENTIN) 875-125 MG tablet Take 1 tablet by mouth every 12 (twelve) hours for 7 days. 04/26/23 05/03/23 Yes Smitty Knudsen, PA-C  ondansetron (ZOFRAN-ODT) 4 MG disintegrating tablet Take 1 tablet (4 mg total) by mouth every 8 (eight) hours as needed for nausea or vomiting. 04/26/23  Yes Smitty Knudsen, PA-C      Allergies    Nickel    Review of Systems   Review of Systems  Respiratory:  Positive for cough.   All other systems reviewed and are negative.   Physical Exam Updated Vital Signs BP 132/87 (BP Location: Right Arm)   Pulse 78   Temp 98.9 F (37.2 C) (Oral)   Resp 18   Ht 5\' 3"  (1.6 m)   Wt 70.3 kg   SpO2 98%   BMI 27.46 kg/m  Physical Exam Vitals and nursing note reviewed.  Constitutional:      General: She is not in acute distress.    Appearance: She is well-developed.  HENT:     Head: Normocephalic and atraumatic.     Comments: Some tenderness to palpation of the maxillary sinuses Eyes:     Conjunctiva/sclera: Conjunctivae normal.   Cardiovascular:     Rate and Rhythm: Normal rate and regular rhythm.     Heart sounds: No murmur heard. Pulmonary:     Effort: Pulmonary effort is normal. No respiratory distress.     Breath sounds: Normal breath sounds.  Abdominal:     Palpations: Abdomen is soft.     Tenderness: There is no abdominal tenderness.  Musculoskeletal:        General: No swelling.     Cervical back: Neck supple.  Skin:    General: Skin is warm and dry.     Capillary Refill: Capillary refill takes less than 2 seconds.  Neurological:     Mental Status: She is alert.  Psychiatric:        Mood and Affect: Mood normal.     ED Results / Procedures / Treatments   Labs (all labs ordered are listed, but only abnormal results are displayed) Labs Reviewed  RESP PANEL BY RT-PCR (RSV, FLU A&B, COVID)  [id number]    EKG None  Radiology No results found.  Procedures Procedures    Medications Ordered in ED Medications - No data to display  ED Course/ Medical Decision Making/ A&P  Medical Decision Making  This patient presents to the ED for concern of cough.  Differential diagnosis includes COVID-19, influenza, sinusitis   Lab Tests:  I Ordered, and personally interpreted labs.  The pertinent results include: Respiratory viral panel negative for COVID-19, influenza, RSV   Problem List / ED Course:  Patient without significant history presents the emergency department concerns of a cough.  Endorsing cough, congestion, headaches for the last 6 days without significant improvement.  Does endorse some sinus drainage and congestion that she feels that has been improving but may be due to use of Mucinex and Sudafed.  However, does not feel that she is making significant improvement.  No obvious sick contacts with patient states she works at [employer] with multiple contact with individuals there.  Denies any nausea, vomiting, or abdominal pain. Physical exam is  unremarkable and patient otherwise well-appearing.  I suspect this is likely a viral process but will obtain respiratory panel for assessment of this.  On differential is bacterial sinusitis, viral URI, pneumonia.  No acute indication for any treatment as patient is afebrile with no significant cough. Patient's workup is reassuring with a negative viral panel.  Given some tenderness to the maxillary sinuses and about 7 days of symptoms, suspect this is likely bacterial sinus infection.  Will treat with a course of Augmentin antibiotics.  Have also sent Zofran for nausea.  Advised patient return the emergency department for new or worsening symptoms.  Patient discharged home in stable condition.   Final Clinical Impression(s) / ED Diagnoses Final diagnoses:  Acute non-recurrent sinusitis, unspecified location    Rx / DC Orders ED Discharge Orders          Ordered    amoxicillin-clavulanate (AUGMENTIN) 875-125 MG tablet  Every 12 hours        04/26/23 1043    ondansetron (ZOFRAN-ODT) 4 MG disintegrating tablet  Every 8 hours PRN        04/26/23 1043              Smitty Knudsen, PA-C 04/26/23 1045    Terrilee Files, MD 04/26/23 1737

## 2023-04-26 NOTE — Discharge Instructions (Addendum)
You were seen in the ER today for concerns of a cough and sinus pressure. Your viral panel was negative for COVID-19, influenza, and RSV. I have sent a prescription for Augmentin to treat your suspected sinus infection. I have also sent Zofran for nausea as needed. Return to the ER for new or worsening symptoms. Otherwise follow up with your primary care provider.

## 2023-04-26 NOTE — ED Notes (Signed)

## 2023-04-26 NOTE — ED Triage Notes (Signed)
C/o cough, congestion, and headache since Thursday. Denies Fever and SHOB.
